# Patient Record
Sex: Male | Born: 1950
Health system: Southern US, Community
[De-identification: ages and names within clinical notes are randomized; demographics above are authoritative.]

## PROBLEM LIST (undated history)

## (undated) DIAGNOSIS — R7303 Prediabetes: Secondary | ICD-10-CM

## (undated) DIAGNOSIS — E785 Hyperlipidemia, unspecified: Secondary | ICD-10-CM

## (undated) DIAGNOSIS — G20C Parkinsonism, unspecified: Secondary | ICD-10-CM

## (undated) DIAGNOSIS — I10 Essential (primary) hypertension: Secondary | ICD-10-CM

## (undated) HISTORY — PX: OTHER SURGICAL HISTORY: SHX169

---

## 2001-04-13 HISTORY — PX: CORONARY ANGIOPLASTY WITH STENT PLACEMENT: SHX49

## 2006-10-22 ENCOUNTER — Ambulatory Visit: Payer: Self-pay | Admitting: Unknown Physician Specialty

## 2007-03-03 ENCOUNTER — Ambulatory Visit: Payer: Self-pay | Admitting: Cardiology

## 2008-08-16 ENCOUNTER — Ambulatory Visit: Payer: Self-pay | Admitting: Internal Medicine

## 2009-11-09 ENCOUNTER — Emergency Department: Payer: Self-pay | Admitting: Emergency Medicine

## 2012-05-02 ENCOUNTER — Ambulatory Visit: Payer: Self-pay | Admitting: Unknown Physician Specialty

## 2012-05-03 LAB — PATHOLOGY REPORT

## 2014-09-18 ENCOUNTER — Ambulatory Visit: Payer: BLUE CROSS/BLUE SHIELD | Attending: Internal Medicine

## 2014-09-18 DIAGNOSIS — G4733 Obstructive sleep apnea (adult) (pediatric): Secondary | ICD-10-CM | POA: Insufficient documentation

## 2014-11-14 ENCOUNTER — Ambulatory Visit: Payer: BLUE CROSS/BLUE SHIELD | Attending: Specialist

## 2014-11-14 DIAGNOSIS — Z9989 Dependence on other enabling machines and devices: Secondary | ICD-10-CM | POA: Diagnosis not present

## 2014-11-14 DIAGNOSIS — G4761 Periodic limb movement disorder: Secondary | ICD-10-CM | POA: Diagnosis not present

## 2014-11-14 DIAGNOSIS — G4733 Obstructive sleep apnea (adult) (pediatric): Secondary | ICD-10-CM | POA: Diagnosis present

## 2017-08-05 DIAGNOSIS — Z86010 Personal history of colonic polyps: Secondary | ICD-10-CM | POA: Diagnosis not present

## 2017-08-05 DIAGNOSIS — K625 Hemorrhage of anus and rectum: Secondary | ICD-10-CM | POA: Diagnosis not present

## 2017-09-01 DIAGNOSIS — I10 Essential (primary) hypertension: Secondary | ICD-10-CM | POA: Diagnosis not present

## 2017-09-01 DIAGNOSIS — I2510 Atherosclerotic heart disease of native coronary artery without angina pectoris: Secondary | ICD-10-CM | POA: Diagnosis not present

## 2017-09-01 DIAGNOSIS — G4733 Obstructive sleep apnea (adult) (pediatric): Secondary | ICD-10-CM | POA: Diagnosis not present

## 2017-09-16 DIAGNOSIS — K648 Other hemorrhoids: Secondary | ICD-10-CM | POA: Diagnosis not present

## 2017-09-16 DIAGNOSIS — Z86010 Personal history of colonic polyps: Secondary | ICD-10-CM | POA: Diagnosis not present

## 2017-09-16 DIAGNOSIS — K640 First degree hemorrhoids: Secondary | ICD-10-CM | POA: Diagnosis not present

## 2017-09-16 DIAGNOSIS — K5730 Diverticulosis of large intestine without perforation or abscess without bleeding: Secondary | ICD-10-CM | POA: Diagnosis not present

## 2017-12-24 DIAGNOSIS — E785 Hyperlipidemia, unspecified: Secondary | ICD-10-CM | POA: Diagnosis not present

## 2017-12-24 DIAGNOSIS — Z125 Encounter for screening for malignant neoplasm of prostate: Secondary | ICD-10-CM | POA: Diagnosis not present

## 2017-12-24 DIAGNOSIS — Z79899 Other long term (current) drug therapy: Secondary | ICD-10-CM | POA: Diagnosis not present

## 2017-12-24 DIAGNOSIS — I10 Essential (primary) hypertension: Secondary | ICD-10-CM | POA: Diagnosis not present

## 2017-12-27 DIAGNOSIS — I10 Essential (primary) hypertension: Secondary | ICD-10-CM | POA: Diagnosis not present

## 2017-12-27 DIAGNOSIS — G4733 Obstructive sleep apnea (adult) (pediatric): Secondary | ICD-10-CM | POA: Diagnosis not present

## 2017-12-27 DIAGNOSIS — Z0000 Encounter for general adult medical examination without abnormal findings: Secondary | ICD-10-CM | POA: Diagnosis not present

## 2018-03-14 DIAGNOSIS — I2510 Atherosclerotic heart disease of native coronary artery without angina pectoris: Secondary | ICD-10-CM | POA: Diagnosis not present

## 2018-03-14 DIAGNOSIS — I10 Essential (primary) hypertension: Secondary | ICD-10-CM | POA: Diagnosis not present

## 2018-03-14 DIAGNOSIS — G4733 Obstructive sleep apnea (adult) (pediatric): Secondary | ICD-10-CM | POA: Diagnosis not present

## 2018-06-27 DIAGNOSIS — R0689 Other abnormalities of breathing: Secondary | ICD-10-CM | POA: Diagnosis not present

## 2018-06-27 DIAGNOSIS — I2510 Atherosclerotic heart disease of native coronary artery without angina pectoris: Secondary | ICD-10-CM | POA: Diagnosis not present

## 2018-06-27 DIAGNOSIS — I10 Essential (primary) hypertension: Secondary | ICD-10-CM | POA: Diagnosis not present

## 2018-06-27 DIAGNOSIS — E785 Hyperlipidemia, unspecified: Secondary | ICD-10-CM | POA: Diagnosis not present

## 2018-06-27 DIAGNOSIS — Z79899 Other long term (current) drug therapy: Secondary | ICD-10-CM | POA: Diagnosis not present

## 2018-06-27 DIAGNOSIS — G4733 Obstructive sleep apnea (adult) (pediatric): Secondary | ICD-10-CM | POA: Diagnosis not present

## 2018-07-28 DIAGNOSIS — I10 Essential (primary) hypertension: Secondary | ICD-10-CM | POA: Diagnosis not present

## 2019-02-27 ENCOUNTER — Other Ambulatory Visit: Payer: Self-pay

## 2019-02-27 DIAGNOSIS — Z20822 Contact with and (suspected) exposure to covid-19: Secondary | ICD-10-CM

## 2019-03-01 LAB — NOVEL CORONAVIRUS, NAA: SARS-CoV-2, NAA: DETECTED — AB

## 2019-06-12 ENCOUNTER — Other Ambulatory Visit: Payer: Self-pay | Admitting: Internal Medicine

## 2019-06-12 DIAGNOSIS — R945 Abnormal results of liver function studies: Secondary | ICD-10-CM

## 2019-06-12 DIAGNOSIS — N289 Disorder of kidney and ureter, unspecified: Secondary | ICD-10-CM

## 2019-06-12 DIAGNOSIS — R7989 Other specified abnormal findings of blood chemistry: Secondary | ICD-10-CM

## 2019-06-16 ENCOUNTER — Other Ambulatory Visit: Payer: Self-pay

## 2019-06-16 ENCOUNTER — Ambulatory Visit
Admission: RE | Admit: 2019-06-16 | Discharge: 2019-06-16 | Disposition: A | Discharge status: Home / Self Care | Payer: 59 | Source: Ambulatory Visit | Attending: Internal Medicine | Admitting: Internal Medicine

## 2019-06-16 DIAGNOSIS — R945 Abnormal results of liver function studies: Secondary | ICD-10-CM | POA: Diagnosis present

## 2019-06-16 DIAGNOSIS — R7989 Other specified abnormal findings of blood chemistry: Secondary | ICD-10-CM

## 2019-06-16 DIAGNOSIS — N289 Disorder of kidney and ureter, unspecified: Secondary | ICD-10-CM | POA: Insufficient documentation

## 2020-08-23 ENCOUNTER — Other Ambulatory Visit: Payer: Self-pay | Admitting: Internal Medicine

## 2020-08-23 DIAGNOSIS — R270 Ataxia, unspecified: Secondary | ICD-10-CM

## 2020-09-05 ENCOUNTER — Other Ambulatory Visit: Payer: Self-pay

## 2020-09-05 ENCOUNTER — Ambulatory Visit
Admission: RE | Admit: 2020-09-05 | Discharge: 2020-09-05 | Disposition: A | Discharge status: Home / Self Care | Payer: Medicare Other | Source: Ambulatory Visit | Attending: Internal Medicine | Admitting: Internal Medicine

## 2020-09-05 DIAGNOSIS — R270 Ataxia, unspecified: Secondary | ICD-10-CM | POA: Insufficient documentation

## 2020-12-12 ENCOUNTER — Ambulatory Visit: Payer: Medicare Other | Attending: Neurology

## 2020-12-12 DIAGNOSIS — R278 Other lack of coordination: Secondary | ICD-10-CM | POA: Diagnosis present

## 2020-12-12 DIAGNOSIS — M6281 Muscle weakness (generalized): Secondary | ICD-10-CM | POA: Insufficient documentation

## 2020-12-12 DIAGNOSIS — G20 Parkinson's disease: Secondary | ICD-10-CM | POA: Insufficient documentation

## 2020-12-12 DIAGNOSIS — R2689 Other abnormalities of gait and mobility: Secondary | ICD-10-CM | POA: Diagnosis present

## 2020-12-12 DIAGNOSIS — G20A1 Parkinson's disease without dyskinesia, without mention of fluctuations: Secondary | ICD-10-CM

## 2020-12-12 DIAGNOSIS — R2681 Unsteadiness on feet: Secondary | ICD-10-CM

## 2020-12-13 NOTE — Therapy (Signed)
[facility] MAIN [facility] SERVICES [address] [place], [place], [address] Phone: [phone]   Fax:  [phone]  Physical Therapy Evaluation  Patient Details  Name: Kyle Conner MRN: [id number] Date of Birth: [birth year] Referring Provider (PT): Lonell Face, MD   Encounter Date: 12/12/2020   PT End of Session - 12/13/20 1151     Visit Number 1    Number of Visits 17    Date for PT Re-Evaluation 01/16/21    Authorization Type LSVT BIG, eval performed 12/12/2020    Authorization Time Period 12/12/2020-01/16/2021    Authorization - Visit Number 1    Authorization - Number of Visits 17    Equipment Utilized During Treatment Gait belt    Activity Tolerance Patient tolerated treatment well    Behavior During Therapy [facility] for tasks assessed/performed             History reviewed. No pertinent past medical history.  History reviewed. No pertinent surgical history.  There were no vitals filed for this visit.    Subjective Assessment - 12/12/20 0956     Subjective Pt is a pleasant 70 y/o male presenting to PT for LSVT BIG evaluation. Pt reports onset of Parkinson's sx occured over 3 years ago with noticing initial changes in gait and tremor of L hand. Pt reports gradual worsening of gait with shuffling, festination. Denies freezing episodes. Reports one fall over six months ago d/t gait festination. Pt also reports feelings of "stiffness" impacting his ability to perform transfers. He reports FOF with ambulating and ascending/descending steps. Pt does not use an AD, but does express that gait is his greatest concern, where he is unable to ambulate for long periods of time and has difficulty with ambulating over uneven, compliant surfaces and up/down hills. He has recently retired and feels this has negatively impacted his mobility, but did see some improvement after joining a gym to strengthen LEs. Pt reports worsening handwriting (but not  decreased in size), does have LUE Dupuytren contracture of 5th digit, is ambidextrous but writes with L. Pt lives with his wife and son. Reports wife must help him clip toenails as he has trouble bending to reach his feet. He would like to be able to safely go on long walks with his wife and attend his son's soccer games without fear of falls.    Pertinent History Pt is a pleasant 70 y/o male presenting to PT for LSVT BIG evaluation. Pt reports onset of Parkinson's sx occured over 3 years ago with noticing initial changes in gait and tremor of L hand. Pt reports gradual worsening of gait with shuffling, festination. Denies freezing episodes. Reports one fall over six months ago d/t gait festination. Pt also reports feelings of "stiffness" impacting his ability to perform transfers. He reports FOF with ambulating and ascending/descending steps. Pt does not use an AD, but does express that gait is his greatest concern, where he is unable to ambulate for long periods of time and has difficulty with ambulating over uneven, compliant surfaces and up/down hills. Per chart other PMH is significant for: chronic lacunar infarct within L lentiform nucleus, sleep apnea (uses CPAP), Dupuytren Contracture LUE 5th digit, mild visual hallucinations, COVID (2020), uses hearing aids, blockage of coronary artery of heart, h/o cardiac cath (2008), HLD, HTN.    Limitations Walking;Writing;Standing;Sitting;House hold activities    How long can you sit comfortably? Pt reports >1 hr if chair is padded; pt reports if hard chair  he is limited to 30 min d/t discomfort from numbness in LEs    How long can you stand comfortably? Pt reports if he is fatigued he is only able to stand 30-45 min.    How long can you walk comfortably? 50 yards    Diagnostic tests imging 09/05/2020 MR brain WO contrast: "IMPRESSION:  No evidence of acute intracranial abnormality.  Moderate generalized cerebral atrophy and cerebral white matter  chronic small  vessel ischemic disease, progressed from the head CT  of 11/09/2009.  Small chronic lacunar infarct within the left lentiform nucleus.  Comparatively mild cerebellar atrophy.  Minimal right maxillary sinus mucosal thickening."    Patient Stated Goals Would like to walk without shuffling    Currently in Pain? No/denies                [facility] PT Assessment - 12/12/20 1018       Assessment   Medical Diagnosis Parkinson's    Referring Provider (PT) Lonell Face, MD    Onset Date/Surgical Date --   over 3 years ago   Hand Dominance Right;Left    Next MD Visit in september    Prior Therapy no      Precautions   Precautions Fall      Restrictions   Weight Bearing Restrictions No      Balance Screen   Has the patient fallen in the past 6 months No      Home Environment   Living Environment Private residence    Living Arrangements Spouse/significant other;Children    Available Help at Discharge Family    Type of Home House    Home Access Stairs to enter    Entrance Stairs-Number of Steps 4    Entrance Stairs-Rails Left    Home Layout Two level    Alternate Level Stairs-Number of Steps 12    Alternate Level Stairs-Rails Right    Home Equipment Rote - single point;Shower seat - built in;Other (comment)      Prior Function   Level of Independence Independent    Vocation Retired      IT consultant   Overall Cognitive Status Within Functional Limits for tasks assessed      Standardized Balance Assessment   Standardized Balance Assessment Dynamic Gait Index      Dynamic Gait Index   Level Surface Normal    Change in Gait Speed Mild Impairment    Gait with Horizontal Head Turns Normal    Gait with Vertical Head Turns Normal    Gait and Pivot Turn Normal    Step Over Obstacle Mild Impairment    Step Around Obstacles Severe Impairment    Steps Mild Impairment    Total Score 18             LSVT BIG EVALUATION  Neurological and Other Medical Information:  What were  your initial symptoms of Parkinson disease? Pt reports symptoms likely started >3 years ago. He reports first noticing tremor with L hand when reaching for a coffee mug. Pt is now retired, but reports his job at the time required ascending/descending stairs and climbing ladders. He started having difficulty with these tasks. He reports changes in his gait, such as shuffling and leaning forward until almost falling and needing to "catch" himself. Pt did report some improvements in gait after joining a gym and focusing on LE strengthening.   Do you have tremors?  Yes__x_ No____   Do you have any pain? Yes ____ No __x___  If yes, please describe: Pain rating (on scale of 1-10 with 10 being most severe):   Medical Information:   Medication for Parkinson disease: Pt reports he is taking medication specific to treating Parkinson's. He is unsure of the name of the medication. He reports he takes 1 pill 3x/day. Pt reports he and his doctor have discussed possibly increasing dose as pt does not feel the medication is currently helping.    In what ways are your medication(s) for Parkinson's helpful? Pt is unsure if medication or exercise is affecting his sx more.   Do you experience on/off symptoms? Yes _x___ No_____ If yes, please describe: Pt reports sx are worst when he is tired and best in the morning.   Do you experience any dyskinesias? Yes _x____ No _____ If yes, please describe: Reports most noticeable when he is ambulating over uneven surfaces, such as terrain at his son's soccer games and with hills or longer distances.   Motor Symptoms:   When did you first start to notice changes in your movement you associate with Parkinson disease? 3+ years ago  What are your current symptoms? Pt reports tremor (reports not constant), gait difficulty  What is your most significant problem related to movement today? Gait difficulty  What do you do when you want to move the best you possibly can? Pt  reports he uses the elliptical at his gym and machines for LE strengthening at fitness center. His friend who also has Parkinson's works out with him.  Has Parkinson disease caused you to move less or be less active? Yes __x__ No____ If yes, how much less? Reports also d/t retiring and becoming more sedentary.   Why has Parkinson disease caused you to move less? Pt reports he doesn't want to fall. Reports only 1 fall (greater than 6 months ago). Reports he was fatigued with walking outside and fell forward on the grass, no injuries.   Have you noticed if your movement is slower than it used to be? For example, walking, getting dressed, doing household chores, bathing, etc.  Yes _x__ No____ If yes please describe: Reports decreased gait speed  Have you or others noticed any changes in your posture? Yes _x___ No____ If yes, please describe: Reports pt feels he leans forward  How many (if any) falls have you had in the last six months? 0  What factors contributed to those falls? No recent falls, but does report history of 1 fall over 6 months ago. Pt reports it occurred when he was trying to increase is walking speed.  Have you noticed changes in your stamina? Yes _x__ No_____  If yes, please describe:  Have you noticed any freezing with your movement? Yes x____ No____ If yes, when do you notice freezing? Pt reports feeling a "stiff" with transfers.   Are there some activities you now need help with because of your Parkinson disease? For example, getting socks or shoes on, buttoning, getting up from low chairs, walking on uneven ground, etc.  Pt states "I can't reach my toes." Pt reports difficulty with bending, his wife helps with clipping his toenails.   Have you noticed any changes in the functioning of your hands? Yes ____ No____x_ ; If yes please describe: Does report occasional tremor. Otherwise, reports no changes d/t Parkinson's, but has trigger finger on R hand and reports Dupuytren's  contracture on L hand. Pt says he has had these issues "for years."   Has Parkinson disease caused you to use your  more affected hand less? Yes ____ No_x___Pt is ambidextrous and primarily writes using L hand.  If you have problems with your hand function today, what is/are the most significant problem (s)? Have you noticed any changes in your ability to: -Button: -Dial the phone: -Open containers: -Manipulate money: -Tie shoes: -Write:  -Type or use a computer: -Other: Pt reports he now has "terrible handwriting," that it is less legible, however it is not smaller.    Have you noticed if your hands feel any weaker than they used to? Yes _____ No___x__ If yes, please describe: Does report decreased R arm strength, however, says this is d/t tick bite related injury and past operation on R bicep.   Movement Situations:   If you had one situation in which you wanted to move well, what would it be? Walking  Describe your day in terms of mobility or movement activity/situations (who is present, how do you move, when does it occur, with what devices). When do you find it most difficult to move? Pt reports most difficulty with gait  Why is it difficult to move in these situations/times that you mentioned? Shuffling, anterior lean that makes him feel he is about to fall  What would you like to improve about your ability to move? Gait ability   Assessment:    MMT: gross B LE and UE strength is 5/5 Grip strength is specifically 5/5 R, 4+/5 L  Coordination Some psat-pointing,  with finger to nose (PT finger) Other WFL hands onlap, heel to shin   Coordination/Cerebellar Finger to Nose: Slightly bradykinetic, initial reps slightly dysmetric, improves with reps Heel to Shin: WNL Rapid alternating movements: WNL  Sensation  WNL    Sit to stand assessment:  5x STS: 13.48 sec hands free  Gait assessments:  10 MWT:  0.89 m/s, no AD Comments: Pt exhibits decreased B  step-length, decreased trunk rotation and B arm-swing (R<L), decreased hip extension, shuffling.    Balance Assessments: DGI: 18/24 (indicates fall risk) Comments: Pt exhibited greatest difficulty with change in gait speed, stepping over obstacles and navigating around cones, ascending/descending stairs. Pt observed to be unable to increase gait speed beyond baseline speed. Pt had to decrease speed/almost stop when approaching obstacle for safe foot clearance, slightly bradykinetic. When navigating around cones, pt walks into two cones with heel. Pt requires use of 2 handrails for ascending/descending steps reporting he is worried he'll fall if not using B handrails.  SLB:  2-3 sec BLEs Comments: close CGA, must use UE support to prevent fall.   FOTO: 52 (goal 61)  Functional Component movements:   Sit to stand (required) Floor to stand Stepping over object Walking and turning (around obstacle) Walking on uneven ground Hierarchies:  Going for a long walk with his wife in areas with hills, grass (rates very difficult) Bending over to reach feet to clip toenails (rates very difficult)   Education: PT provides education on length, intensity, frequency of LSVT BIG program, frequency of HEP with program. Pt verbalizes understanding.    Objective measurements completed on examination: See above findings.       PT Education - 12/12/20 1031     Education Details Examination findings, POC, purpose of LSVT BIG, frequency, intensity of program, what to expect    Person(s) Educated Patient    Methods Explanation    Comprehension Verbalized understanding              PT Short Term Goals - 12/13/20 1158  PT SHORT TERM GOAL #1   Title Pt will be independent with LSVT BIG HEP in order to improve functional mobility and balance to decrease fall risk and improve function at home.    Time 3    Period Weeks    Status New    Target Date 01/02/21               PT Long Term  Goals - 12/13/20 1202       PT LONG TERM GOAL #1   Title Patient will increase FOTO score to equal to or greater than 61 to demonstrate statistically significant improvement in mobility and quality of life.    Baseline 9/1: 52    Time 5    Period Weeks    Status New    Target Date 01/16/21      PT LONG TERM GOAL #2   Title Patient will tolerate 5 seconds of single leg stance without loss of balance to improve ability to step over obstacles/up and down curbs safely.    Baseline 9/1: 2-3 sec BLEs    Time 5    Period Weeks    Status New    Target Date 01/16/21      PT LONG TERM GOAL #3   Title Pt will improve DGI by at least 3 points in order to demonstrate clinically significant improvement in balance and decreased risk for falls so pt can safely go on walks outdoors with his wife.    Baseline 9/1: 18/24    Time 5    Period Weeks    Status New    Target Date 01/16/21      PT LONG TERM GOAL #4   Title Patient will increase 10 meter walk test to >1.2m/s as to improve gait speed for better community ambulation.    Baseline 9/1: 0.89 m/s    Time 5    Period Weeks    Status New    Target Date 01/16/21                    Plan - 12/13/20 1214     Clinical Impression Statement Pt is a pleasant 70 y/o male with Parkinson's disease with c/o FOF due to impairments of gait, balance and decreased functional mobility. Examination reveals pt movement is slightly bradykinetic and dysmetric. The pt exhibits impaired gait speed and mechanics as evidenced by score, shuffling gait, decreased B step-length, trunk rotation, arm swing and hip extension. Pt with balance impairment AEB SLB and DGI scores, which also indicate pt is at an increased risk for falls. He was most challenged with turning, navigating obstacles, stepping over objects, and ascending/descnending steps showing mild hypokinetic movement. Pt also reported stiffness during examination with STS. Overall, the  aforementioned impairments are affecting pt's ability to carry out ADLs and transfers safely and with ease. The pt is appropriate for treatment with LSVT BIG Protocol, which is an evidenced-based treatment shown to improve gait speed, step-length, balance and function with ADLs in people with PD. LSVT BIG is delivered 4 times per week for 4 weeks by a certified LSVT BIG therapist. LSVT BIG focuses on increasing amplitude of movements so that the patient can effectively override bradykinesia and hypokinesia symptoms of PD. The patient is in agreement with goals and plan of care and will benefit from further skilled PT to improve the aforementioned deficits.    Personal Factors and Comorbidities Age;Comorbidity 1;Comorbidity 2;Comorbidity 3+;Fitness;Time since onset of injury/illness/exacerbation  Comorbidities chronic lacunar infarct within L lentiform nucleus, hx of COVID19 (2020), HTN, duputyren contracture LUE 5th digit,    Examination-Activity Limitations Hygiene/Grooming;Bend;Stand;Sit;Locomotion Level;Transfers;Stairs;Dressing;Squat    Examination-Participation Restrictions Community Activity;Shop;Yard Work;Cleaning;Laundry    Stability/Clinical Decision Making Evolving/Moderate complexity    Clinical Decision Making Moderate    Rehab Potential Good    PT Frequency 4x / week    PT Duration Other (comment)   coverage for 5 weeks to cover the full 4 week program from evaluation onward   PT Treatment/Interventions ADLs/Self Care Home Management;DME Instruction;Gait training;Stair training;Functional mobility training;Therapeutic activities;Balance training;Therapeutic exercise;Neuromuscular re-education;Patient/family education;Orthotic Fit/Training;Manual techniques;Passive range of motion;Energy conservation;Taping;Splinting    PT Next Visit Plan Initiate maximal daily exercises, functional component task training, hirarchy task trianing, BIG walking, HEP/daiy carryover assignments    PT Home  Exercise Plan to be initiated next session    Consulted and Agree with Plan of Care Patient             Patient will benefit from skilled therapeutic intervention in order to improve the following deficits and impairments:  Abnormal gait, Decreased activity tolerance, Decreased endurance, Decreased range of motion, Decreased knowledge of use of DME, Hypomobility, Improper body mechanics, Impaired UE functional use, Decreased balance, Decreased coordination, Decreased mobility, Difficulty walking, Impaired flexibility, Postural dysfunction  Visit Diagnosis: Parkinson disease (HCC) - Plan: PT plan of care cert/re-cert  Other abnormalities of gait and mobility - Plan: PT plan of care cert/re-cert  Unsteadiness on feet - Plan: PT plan of care cert/re-cert     Problem List There are no problems to display for this patient.  Temple Pacini PT, DPT 12/13/2020, 12:44 PM  [place] [facility] MAIN [facility] SERVICES [address] [place], [place], [address] Phone: [phone]   Fax:  [phone]  Name: Kyle Conner MRN: [id number] Date of Birth: [birth year]

## 2020-12-17 ENCOUNTER — Ambulatory Visit: Payer: Medicare Other

## 2020-12-17 ENCOUNTER — Other Ambulatory Visit: Payer: Self-pay

## 2020-12-17 DIAGNOSIS — R278 Other lack of coordination: Secondary | ICD-10-CM

## 2020-12-17 DIAGNOSIS — M6281 Muscle weakness (generalized): Secondary | ICD-10-CM

## 2020-12-17 DIAGNOSIS — R2681 Unsteadiness on feet: Secondary | ICD-10-CM

## 2020-12-17 DIAGNOSIS — R2689 Other abnormalities of gait and mobility: Secondary | ICD-10-CM

## 2020-12-17 DIAGNOSIS — G20 Parkinson's disease: Secondary | ICD-10-CM | POA: Diagnosis not present

## 2020-12-17 DIAGNOSIS — G20A1 Parkinson's disease without dyskinesia, without mention of fluctuations: Secondary | ICD-10-CM

## 2020-12-17 NOTE — Therapy (Signed)
[facility] MAIN [facility] SERVICES [address] [place], [place], [address] Phone: [phone]   Fax:  [phone]  Physical Therapy Treatment  Patient Details  Name: Kyle Conner MRN: [id number] Date of Birth: [birth year] Referring Provider (PT): Lonell Face, MD   Encounter Date: 12/17/2020   PT End of Session - 12/17/20 1127     Visit Number 2    Number of Visits 17    Date for PT Re-Evaluation 01/16/21    Authorization Type LSVT BIG, eval performed 12/12/2020    Authorization Time Period 12/12/2020-01/16/2021    Authorization - Visit Number 1    Authorization - Number of Visits 17    PT Start Time 1002    PT Stop Time 1100    PT Time Calculation (min) 58 min    Equipment Utilized During Treatment Gait belt    Activity Tolerance Patient tolerated treatment well    Behavior During Therapy [facility] for tasks assessed/performed             History reviewed. No pertinent past medical history.  History reviewed. No pertinent surgical history.  There were no vitals filed for this visit.   Subjective Assessment - 12/17/20 1004     Subjective Pt reports no pain currently and feeling OK. Pt denies falls or near-falls.    Pertinent History Pt is a pleasant 71 y/o male presenting to PT for LSVT BIG evaluation. Pt reports onset of Parkinson's sx occured over 3 years ago with noticing initial changes in gait and tremor of L hand. Pt reports gradual worsening of gait with shuffling, festination. Denies freezing episodes. Reports one fall over six months ago d/t gait festination. Pt also reports feelings of "stiffness" impacting his ability to perform transfers. He reports FOF with ambulating and ascending/descending steps. Pt does not use an AD, but does express that gait is his greatest concern, where he is unable to ambulate for long periods of time and has difficulty with ambulating over uneven, compliant surfaces and up/down hills. Per chart  other PMH is significant for: chronic lacunar infarct within L lentiform nucleus, sleep apnea (uses CPAP), Dupuytren Contracture LUE 5th digit, mild visual hallucinations, COVID (2020), uses hearing aids, blockage of coronary artery of heart, h/o cardiac cath (2008), HLD, HTN.    Limitations Walking;Writing;Standing;Sitting;House hold activities    How long can you sit comfortably? Pt reports >1 hr if chair is padded; pt reports if hard chair he is limited to 30 min d/t discomfort from numbness in LEs    How long can you stand comfortably? Pt reports if he is fatigued he is only able to stand 30-45 min.    How long can you walk comfortably? 50 yards    Diagnostic tests imging 09/05/2020 MR brain WO contrast: "IMPRESSION:  No evidence of acute intracranial abnormality.  Moderate generalized cerebral atrophy and cerebral white matter  chronic small vessel ischemic disease, progressed from the head CT  of 11/09/2009.  Small chronic lacunar infarct within the left lentiform nucleus.  Comparatively mild cerebellar atrophy.  Minimal right maxillary sinus mucosal thickening."    Patient Stated Goals Would like to walk without shuffling    Currently in Pain? No/denies              LSVT BIG Treatment:  Patient seen for LSVT Daily Session Maximal Daily Exercises for facilitation/coordination of movement Sustained movements are designed to rescale the amplitude of movement output for generalization to daily functional activities.  Maximal Daily Exercises: Floor to Ceiling 8 with 10 second hold Side to Side 8 Bilateral with 10 second hold  Forward Step and Reach 10 Bilateral Sideways Step and Reach 10 Bilateral Backwards Step and Reach 10 Bilateral Forwards Rock and Reach 10 Bilateral Sideways Rock and Reach 10 Bilateral  Maximal Daily Exercise Comments: Pt currently requires modeling along with VC/TC for all Maximal Daily Exercises. Pt with greatest difficult with trunk rotation, UE abduction,  flexion and supination. PT provides frequent hands-on assist for UE supination along with VC. Cuing for increased effort and amplitude throughout as well was for upright posture to override hypokinesia and bradykinesia. Pt rates exercises as medium.  Functional Component Tasks - performance and progress: 5 reps for each, and 5 reps for each LE with tasks 2-5. Sit to Stand x 5 from standard chair with cuing for big reach/big effort with UE reach and abduction upon standing, cuing for finger abduction/big hands.  2. Lunge to stand with UE support (modified from original task of floor to stand to assess if pt has balance necessary for task, may be able to return to floor to stand next session depending on pt report with homework).  3. Big step over object; pt fatigued, required rest break 4. Walk and Turn (around object); 5. Walk on compliant surface Comments: PT instructs pt to use UE support on support surface, while PT also provides SBA-CGA throughout. Frequent use of modeling, cuing for large amplitude/big effort, to increase step-length, hip flexion for stepping over object.   Hierarchy Tasks - performance and progress: Seated big reach 10x with each UE to each ipsilateral LE.  2. Walking on mat also performed as functional component task to challenge pt with gait mechanics on compliant surface to simulate grassy surface, with CGA.   Gait - SBA-CGA provided due to festination.  PT provides cuing for big steps, big posture, big arm swing 90% of time to override hypokinesia/bradykinesia. Pt requires frequent cuing to sustain increased amplitude. Pt ambulated 462 feet on firm surfaces with some distractions d/t business of clinic, kicked over a cone that was on floor, difficulty scanning with distractions.   Carryover Assignment: Pt instructed to use big effort with stepping out of car with instructions for safety (scan to clear car door as not to hit head/self on car when performing).  Pt  verbalized understanding.  Homework: Pt to perform all 7 LSVT BIG exercises, 5 Functional Component Tasks and big walking. Instructed to use support surface for functional component tasks (except for STS) on this date for safety.    Pt educated throughout session about proper posture and technique with exercises. Improved exercise technique, movement at target joints, use of target muscles after min to mod verbal, visual, tactile cues.    PT Short Term Goals - 12/17/20 1131       PT SHORT TERM GOAL #1   Title Pt will be independent with LSVT BIG HEP in order to improve functional mobility and balance to decrease fall risk and improve function at home.    Baseline 9/6: initiated    Time 3    Period Weeks    Status New    Target Date 01/02/21               PT Long Term Goals - 12/13/20 1202       PT LONG TERM GOAL #1   Title Patient will increase FOTO score to equal to or greater than 61 to demonstrate statistically significant improvement  in mobility and quality of life.    Baseline 9/1: 52    Time 5    Period Weeks    Status New    Target Date 01/16/21      PT LONG TERM GOAL #2   Title Patient will tolerate 5 seconds of single leg stance without loss of balance to improve ability to step over obstacles/up and down curbs safely.    Baseline 9/1: 2-3 sec BLEs    Time 5    Period Weeks    Status New    Target Date 01/16/21      PT LONG TERM GOAL #3   Title Pt will improve DGI by at least 3 points in order to demonstrate clinically significant improvement in balance and decreased risk for falls so pt can safely go on walks outdoors with his wife.    Baseline 9/1: 18/24    Time 5    Period Weeks    Status New    Target Date 01/16/21      PT LONG TERM GOAL #4   Title Patient will increase 10 meter walk test to >1.35m/s as to improve gait speed for better community ambulation.    Baseline 9/1: 0.89 m/s    Time 5    Period Weeks    Status New    Target Date 01/16/21                    Plan - 12/17/20 1118     Clinical Impression Statement Initiated maximal daily exercises, functional component tasks, hierarchy tasks, gait training and carryover assignment with HEP this session. Pt verbalized understanding of HEP and safety instructions with HEP. Pt currently requires frequent use of modeling/demo, VC/TC with all interventions to override bradykinesia and hypokinesia, particularly with use of UEs for arm swing and with stepping. Pt exhibited festinating gait with intermittent ability to improve step-length and arm-swing this session. He required cuing for correct gait mechanics and sustained large effort 90% of the time. The pt will benefit from further skilled LSVT BIG treatment to continue to improve bradykinesia, hypokinesia, impaired gat mechanics, and decreased ROM to improve ease and safety with ADLs.    Personal Factors and Comorbidities Age;Comorbidity 1;Comorbidity 2;Comorbidity 3+;Fitness;Time since onset of injury/illness/exacerbation    Comorbidities chronic lacunar infarct within L lentiform nucleus, hx of COVID19 (2020), HTN, duputyren contracture LUE 5th digit,    Examination-Activity Limitations Hygiene/Grooming;Bend;Stand;Sit;Locomotion Level;Transfers;Stairs;Dressing;Squat    Examination-Participation Restrictions Community Activity;Shop;Yard Work;Cleaning;Laundry    Stability/Clinical Decision Making Evolving/Moderate complexity    Rehab Potential Good    PT Frequency 4x / week    PT Duration Other (comment)   coverage for 5 weeks to cover the full 4 week program from evaluation onward   PT Treatment/Interventions ADLs/Self Care Home Management;DME Instruction;Gait training;Stair training;Functional mobility training;Therapeutic activities;Balance training;Therapeutic exercise;Neuromuscular re-education;Patient/family education;Orthotic Fit/Training;Manual techniques;Passive range of motion;Energy conservation;Taping;Splinting    PT  Next Visit Plan Initiate maximal daily exercises, functional component task training, hirarchy task trianing, BIG walking, HEP/daiy carryover assignments    PT Home Exercise Plan 7 LSVT BIG exercises, 5 functional component tasks, big walking and carryover assignment (see note 9/6 for carryover). Handout/packet provided with additional written instructions.    Consulted and Agree with Plan of Care Patient             Patient will benefit from skilled therapeutic intervention in order to improve the following deficits and impairments:  Abnormal gait, Decreased activity tolerance, Decreased endurance, Decreased range of  motion, Decreased knowledge of use of DME, Hypomobility, Improper body mechanics, Impaired UE functional use, Decreased balance, Decreased coordination, Decreased mobility, Difficulty walking, Impaired flexibility, Postural dysfunction  Visit Diagnosis: Other abnormalities of gait and mobility  Other lack of coordination  Muscle weakness (generalized)  Unsteadiness on feet  Parkinson disease (HCC)     Problem List There are no problems to display for this patient.  Temple PaciniHaley Nitesh Pitstick PT, DPT 12/17/2020, 11:32 AM  [facility] [address] [place], [place], [address] Phone: [phone]   Fax:  [phone]  Name: Kyle Conner MRN: [id number] Date of Birth: 09/24/1950

## 2020-12-18 ENCOUNTER — Ambulatory Visit: Payer: Medicare Other

## 2020-12-18 DIAGNOSIS — R2689 Other abnormalities of gait and mobility: Secondary | ICD-10-CM

## 2020-12-18 DIAGNOSIS — G20 Parkinson's disease: Secondary | ICD-10-CM | POA: Diagnosis not present

## 2020-12-18 DIAGNOSIS — M6281 Muscle weakness (generalized): Secondary | ICD-10-CM

## 2020-12-18 DIAGNOSIS — R2681 Unsteadiness on feet: Secondary | ICD-10-CM

## 2020-12-18 DIAGNOSIS — R278 Other lack of coordination: Secondary | ICD-10-CM

## 2020-12-18 NOTE — Therapy (Signed)
[facility] MAIN [facility] SERVICES [address] [place], [place], [address] Phone: [phone]   Fax:  [phone]  Physical Therapy Treatment  Patient Details  Name: Kyle Conner MRN: [id number] Date of Birth: [birth year] Referring Provider (PT): Lonell Face, MD   Encounter Date: 12/18/2020   PT End of Session - 12/18/20 1129     Visit Number 3    Number of Visits 17    Date for PT Re-Evaluation 01/16/21    Authorization Type LSVT BIG, eval performed 12/12/2020    Authorization Time Period 12/12/2020-01/16/2021    Authorization - Visit Number 1    Authorization - Number of Visits 17    PT Start Time 1019    PT Stop Time 1115    PT Time Calculation (min) 56 min    Equipment Utilized During Treatment Gait belt    Activity Tolerance Patient tolerated treatment well;Patient limited by fatigue    Behavior During Therapy [facility] for tasks assessed/performed             History reviewed. No pertinent past medical history.  History reviewed. No pertinent surgical history.  There were no vitals filed for this visit.   Subjective Assessment - 12/18/20 1019     Subjective Pt reports no pain. Pt reports he did not do HEP and says he forgot about and is going to try to remember to perform it today.    Pertinent History Pt is a pleasant 70 y/o male presenting to PT for LSVT BIG evaluation. Pt reports onset of Parkinson's sx occured over 3 years ago with noticing initial changes in gait and tremor of L hand. Pt reports gradual worsening of gait with shuffling, festination. Denies freezing episodes. Reports one fall over six months ago d/t gait festination. Pt also reports feelings of "stiffness" impacting his ability to perform transfers. He reports FOF with ambulating and ascending/descending steps. Pt does not use an AD, but does express that gait is his greatest concern, where he is unable to ambulate for long periods of time and has difficulty  with ambulating over uneven, compliant surfaces and up/down hills. Per chart other PMH is significant for: chronic lacunar infarct within L lentiform nucleus, sleep apnea (uses CPAP), Dupuytren Contracture LUE 5th digit, mild visual hallucinations, COVID (2020), uses hearing aids, blockage of coronary artery of heart, h/o cardiac cath (2008), HLD, HTN.    Limitations Walking;Writing;Standing;Sitting;House hold activities    How long can you sit comfortably? Pt reports >1 hr if chair is padded; pt reports if Conner chair he is limited to 30 min d/t discomfort from numbness in LEs    How long can you stand comfortably? Pt reports if he is fatigued he is only able to stand 30-45 min.    How long can you walk comfortably? 50 yards    Diagnostic tests imging 09/05/2020 MR brain WO contrast: "IMPRESSION:  No evidence of acute intracranial abnormality.  Moderate generalized cerebral atrophy and cerebral white matter  chronic small vessel ischemic disease, progressed from the head CT  of 11/09/2009.  Small chronic lacunar infarct within the left lentiform nucleus.  Comparatively mild cerebellar atrophy.  Minimal right maxillary sinus mucosal thickening."    Patient Stated Goals Would like to walk without shuffling    Currently in Pain? No/denies                 [facility] MAIN [facility] SERVICES [address] [place],  [place], [address] Phone: [phone]   Fax:  [phone]  Physical Therapy Treatment  Patient Details  Name: Kyle Conner MRN: [id number] Date of Birth: 14-Jun-1950 Referring Provider (PT): Lonell Face, MD   Encounter Date: 12/18/2020   PT End of Session - 12/18/20 1129     Visit Number 3    Number of Visits 17    Date for PT Re-Evaluation 01/16/21    Authorization Type LSVT BIG, eval performed 12/12/2020    Authorization Time Period 12/12/2020-01/16/2021    Authorization - Visit Number 1    Authorization - Number of Visits 17    PT  Start Time 1019    PT Stop Time 1115    PT Time Calculation (min) 56 min    Equipment Utilized During Treatment Gait belt    Activity Tolerance Patient tolerated treatment well;Patient limited by fatigue    Behavior During Therapy [facility] for tasks assessed/performed              History reviewed. No pertinent past medical history.  History reviewed. No pertinent surgical history.  There were no vitals filed for this visit.   Subjective Assessment - 12/18/20 1019     Subjective Pt reports no pain. Pt reports he did not do HEP and says he forgot about and is going to try to remember to perform it today.    Pertinent History Pt is a pleasant 70 y/o male presenting to PT for LSVT BIG evaluation. Pt reports onset of Parkinson's sx occured over 3 years ago with noticing initial changes in gait and tremor of L hand. Pt reports gradual worsening of gait with shuffling, festination. Denies freezing episodes. Reports one fall over six months ago d/t gait festination. Pt also reports feelings of "stiffness" impacting his ability to perform transfers. He reports FOF with ambulating and ascending/descending steps. Pt does not use an AD, but does express that gait is his greatest concern, where he is unable to ambulate for long periods of time and has difficulty with ambulating over uneven, compliant surfaces and up/down hills. Per chart other PMH is significant for: chronic lacunar infarct within L lentiform nucleus, sleep apnea (uses CPAP), Dupuytren Contracture LUE 5th digit, mild visual hallucinations, COVID (2020), uses hearing aids, blockage of coronary artery of heart, h/o cardiac cath (2008), HLD, HTN.    Limitations Walking;Writing;Standing;Sitting;House hold activities    How long can you sit comfortably? Pt reports >1 hr if chair is padded; pt reports if Conner chair he is limited to 30 min d/t discomfort from numbness in LEs    How long can you stand comfortably? Pt reports if he is fatigued he is  only able to stand 30-45 min.    How long can you walk comfortably? 50 yards    Diagnostic tests imging 09/05/2020 MR brain WO contrast: "IMPRESSION:  No evidence of acute intracranial abnormality.  Moderate generalized cerebral atrophy and cerebral white matter  chronic small vessel ischemic disease, progressed from the head CT  of 11/09/2009.  Small chronic lacunar infarct within the left lentiform nucleus.  Comparatively mild cerebellar atrophy.  Minimal right maxillary sinus mucosal thickening."    Patient Stated Goals Would like to walk without shuffling    Currently in Pain? No/denies              LSVT BIG Treatment:  Patient seen for LSVT Daily Session Maximal Daily Exercises for facilitation/coordination of movement Sustained movements are designed to rescale the amplitude of movement output for  generalization to daily functional activities.  Maximal Daily Exercises: Floor to Ceiling 8 with 10 second hold Side to Side 8 Bilateral with 10 second hold  Forward Step and Reach 10 Bilateral Sideways Step and Reach 10 Bilateral Backwards Step and Reach 10 Bilateral Forwards Rock and Reach 10 Bilateral Sideways Rock and Reach 10 Bilateral  Maximal Daily Exercise Comments: PT continues to provide multimodal cueing including verbal cues for larger amplitude movement with all maximal daily exercises.  Patient continues to require hands-on assist for shaping with upright posture and trunk rotation as well as upper extremity abduction.  Patient has greatest difficulty with trunk rotation to the right.  Patient reports fatigue with exercise.  Functional Component Tasks - performance and progress: 5 reps for each, and 5 reps for each LE with tasks 2-5. Sit to Stand x 5 from standard chair with cuing for big reach/big effort with UE reach and abduction upon standing, continued cuing for finger abduction/big hands.  2. Kneeling lunge to stand with UE support - reports easier to stand up from RLE  kneeling compared to LLE. Improved technique with reps. 3. Big step over object; cueing for increased step height and length bilaterally 4. Walk and Turn (around object); 5. Walk on compliant surface; patient with decreased postural stability and uses step strategy 2 times; PT provides contact-guard assist to min assist Comments: Continued use of modeling, cuing for large amplitude/big effort, to increase step-length, hip flexion for stepping over object.  Patient exhibits hypokinesia of left lower extremity movement and upper extremity movement compared to right.  Hierarchy Tasks - performance and progress: Seated big reach 10x with each UE to each ipsilateral LE.  2. Walking on mat with focus on big step with turning 2x5 rounds  Gait - SBA-CGA provided due to festination.  PT provides cuing for big steps, big posture, big arm swing, and patient continues to require cueing 90% of time to override hypokinesia/bradykinesia. Pt requires continued cuing to sustain increased amplitude. Pt ambulated 592 feet on firm surfaces with some distractions d/t business of clinic.  Patient significantly fatigued by last lap with increased festination of gait, difficulty with dual motor task (arm swing) and required rest break after completion of lap.  With increased postural instability with last lap.   Carryover Assignment: Pt going to electronics store later with his computer. Instructed pt to use big reach when going to grab his computer.   Homework: Pt to perform all 7 LSVT BIG exercises, 5 Functional Component Tasks and big walking. Instructed to use support surface for functional component tasks (except for STS) on this date for safety. Updated carryover assignment.    Pt educated throughout session about proper posture and technique with exercises. Improved exercise technique, movement at target joints, use of target muscles after min to mod verbal, visual, tactile cues.   Note: Portions of this  document were prepared using Dragon voice recognition software and although reviewed may contain unintentional dictation errors in syntax, grammar, or spelling.    PT Short Term Goals - 12/17/20 1131       PT SHORT TERM GOAL #1   Title Pt will be independent with LSVT BIG HEP in order to improve functional mobility and balance to decrease fall risk and improve function at home.    Baseline 9/6: initiated    Time 3    Period Weeks    Status New    Target Date 01/02/21  PT Long Term Goals - 12/13/20 1202       PT LONG TERM GOAL #1   Title Patient will increase FOTO score to equal to or greater than 61 to demonstrate statistically significant improvement in mobility and quality of life.    Baseline 9/1: 52    Time 5    Period Weeks    Status New    Target Date 01/16/21      PT LONG TERM GOAL #2   Title Patient will tolerate 5 seconds of single leg stance without loss of balance to improve ability to step over obstacles/up and down curbs safely.    Baseline 9/1: 2-3 sec BLEs    Time 5    Period Weeks    Status New    Target Date 01/16/21      PT LONG TERM GOAL #3   Title Pt will improve DGI by at least 3 points in order to demonstrate clinically significant improvement in balance and decreased risk for falls so pt can safely go on walks outdoors with his wife.    Baseline 9/1: 18/24    Time 5    Period Weeks    Status New    Target Date 01/16/21      PT LONG TERM GOAL #4   Title Patient will increase 10 meter walk test to >1.27m/s as to improve gait speed for better community ambulation.    Baseline 9/1: 0.89 m/s    Time 5    Period Weeks    Status New    Target Date 01/16/21                   Plan - 12/18/20 1119     Clinical Impression Statement Continued performance of LSVT plan of care as laid out in examination.  PT reinforced importance of home exercise program and provided education on exercises frequency, sets, and reps and rest.   Patient verbalized understanding.  During gait training patient fatigued and exhibited festination and decreased ability to modulate gait speed.  The patient also appears to have greater difficulty with trunk rotation to the right the session.  He did show ability to self-correct for upper extremity supination and abduction.  The patient will benefit from further LSVT big treatment to improve bradykinesia and hypokinesia, impaired gait mechanics, and decreased active range of motion to improve ease and safety with ADLs.    Personal Factors and Comorbidities Age;Comorbidity 1;Comorbidity 2;Comorbidity 3+;Fitness;Time since onset of injury/illness/exacerbation    Comorbidities chronic lacunar infarct within L lentiform nucleus, hx of COVID19 (2020), HTN, duputyren contracture LUE 5th digit,    Examination-Activity Limitations Hygiene/Grooming;Bend;Stand;Sit;Locomotion Level;Transfers;Stairs;Dressing;Squat    Examination-Participation Restrictions Community Activity;Shop;Yard Work;Cleaning;Laundry    Stability/Clinical Decision Making Evolving/Moderate complexity    Rehab Potential Good    PT Frequency 4x / week    PT Duration Other (comment)   coverage for 5 weeks to cover the full 4 week program from evaluation onward   PT Treatment/Interventions ADLs/Self Care Home Management;DME Instruction;Gait training;Stair training;Functional mobility training;Therapeutic activities;Balance training;Therapeutic exercise;Neuromuscular re-education;Patient/family education;Orthotic Fit/Training;Manual techniques;Passive range of motion;Energy conservation;Taping;Splinting    PT Next Visit Plan Initiate maximal daily exercises, functional component task training, hirarchy task trianing, BIG walking, HEP/daiy carryover assignments    PT Home Exercise Plan 7 LSVT BIG exercises, 5 functional component tasks, big walking and carryover assignment (see note 9/6 for carryover). Handout/packet provided with additional written  instructions.  Updated carryover assignment on this date 12/18/2020    Consulted and  Agree with Plan of Care Patient              Patient will benefit from skilled therapeutic intervention in order to improve the following deficits and impairments:  Abnormal gait, Decreased activity tolerance, Decreased endurance, Decreased range of motion, Decreased knowledge of use of DME, Hypomobility, Improper body mechanics, Impaired UE functional use, Decreased balance, Decreased coordination, Decreased mobility, Difficulty walking, Impaired flexibility, Postural dysfunction  Visit Diagnosis: Other abnormalities of gait and mobility  Other lack of coordination  Muscle weakness (generalized)  Unsteadiness on feet  Parkinson disease (HCC)     Problem List There are no problems to display for this patient.  Temple Pacini PT, DPT 12/18/2020, 11:31 AM  [facility] MAIN [facility] SERVICES [address] [place], [place], [address] Phone: [phone]   Fax:  [phone]  Name: Kyle Conner MRN: [id number] Date of Birth: [birth year]       PT Short Term Goals - 12/17/20 1131       PT SHORT TERM GOAL #1   Title Pt will be independent with LSVT BIG HEP in order to improve functional mobility and balance to decrease fall risk and improve function at home.    Baseline 9/6: initiated    Time 3    Period Weeks    Status New    Target Date 01/02/21               PT Long Term Goals - 12/13/20 1202       PT LONG TERM GOAL #1   Title Patient will increase FOTO score to equal to or greater than 61 to demonstrate statistically significant improvement in mobility and quality of life.    Baseline 9/1: 52    Time 5    Period Weeks    Status New    Target Date 01/16/21      PT LONG TERM GOAL #2   Title Patient will tolerate 5 seconds of single leg stance without loss of balance to improve ability to step over obstacles/up and down curbs safely.     Baseline 9/1: 2-3 sec BLEs    Time 5    Period Weeks    Status New    Target Date 01/16/21      PT LONG TERM GOAL #3   Title Pt will improve DGI by at least 3 points in order to demonstrate clinically significant improvement in balance and decreased risk for falls so pt can safely go on walks outdoors with his wife.    Baseline 9/1: 18/24    Time 5    Period Weeks    Status New    Target Date 01/16/21      PT LONG TERM GOAL #4   Title Patient will increase 10 meter walk test to >1.44m/s as to improve gait speed for better community ambulation.    Baseline 9/1: 0.89 m/s    Time 5    Period Weeks    Status New    Target Date 01/16/21                   Plan - 12/18/20 1119     Clinical Impression Statement Continued performance of LSVT plan of care as laid out in examination.  PT reinforced importance of home exercise program and provided education on exercises frequency, sets, and reps and rest.  Patient verbalized understanding.  During gait training patient fatigued and exhibited festination and decreased ability to modulate  gait speed.  The patient also appears to have greater difficulty with trunk rotation to the right the session.  He did show ability to self-correct for upper extremity supination and abduction.  The patient will benefit from further LSVT big treatment to improve bradykinesia and hypokinesia, impaired gait mechanics, and decreased active range of motion to improve ease and safety with ADLs.    Personal Factors and Comorbidities Age;Comorbidity 1;Comorbidity 2;Comorbidity 3+;Fitness;Time since onset of injury/illness/exacerbation    Comorbidities chronic lacunar infarct within L lentiform nucleus, hx of COVID19 (2020), HTN, duputyren contracture LUE 5th digit,    Examination-Activity Limitations Hygiene/Grooming;Bend;Stand;Sit;Locomotion Level;Transfers;Stairs;Dressing;Squat    Examination-Participation Restrictions Community Activity;Shop;Yard  Work;Cleaning;Laundry    Stability/Clinical Decision Making Evolving/Moderate complexity    Rehab Potential Good    PT Frequency 4x / week    PT Duration Other (comment)   coverage for 5 weeks to cover the full 4 week program from evaluation onward   PT Treatment/Interventions ADLs/Self Care Home Management;DME Instruction;Gait training;Stair training;Functional mobility training;Therapeutic activities;Balance training;Therapeutic exercise;Neuromuscular re-education;Patient/family education;Orthotic Fit/Training;Manual techniques;Passive range of motion;Energy conservation;Taping;Splinting    PT Next Visit Plan Initiate maximal daily exercises, functional component task training, hirarchy task trianing, BIG walking, HEP/daiy carryover assignments    PT Home Exercise Plan 7 LSVT BIG exercises, 5 functional component tasks, big walking and carryover assignment (see note 9/6 for carryover). Handout/packet provided with additional written instructions.  Updated carryover assignment on this date 12/18/2020    Consulted and Agree with Plan of Care Patient             Patient will benefit from skilled therapeutic intervention in order to improve the following deficits and impairments:  Abnormal gait, Decreased activity tolerance, Decreased endurance, Decreased range of motion, Decreased knowledge of use of DME, Hypomobility, Improper body mechanics, Impaired UE functional use, Decreased balance, Decreased coordination, Decreased mobility, Difficulty walking, Impaired flexibility, Postural dysfunction  Visit Diagnosis: Other abnormalities of gait and mobility  Other lack of coordination  Muscle weakness (generalized)  Unsteadiness on feet  Parkinson disease (HCC)     Problem List There are no problems to display for this patient.   Baird Kay, PT 12/18/2020, 11:31 AM  [place] [facility] MAIN [facility] SERVICES [address] [place], [place], [address] Phone:  [phone]   Fax:  [phone]  Name: Kyle Conner MRN: [id number] Date of Birth: 05-18-1950

## 2020-12-19 ENCOUNTER — Other Ambulatory Visit: Payer: Self-pay

## 2020-12-19 ENCOUNTER — Ambulatory Visit: Payer: Medicare Other

## 2020-12-19 DIAGNOSIS — G20 Parkinson's disease: Secondary | ICD-10-CM | POA: Diagnosis not present

## 2020-12-19 DIAGNOSIS — R2681 Unsteadiness on feet: Secondary | ICD-10-CM

## 2020-12-19 DIAGNOSIS — R2689 Other abnormalities of gait and mobility: Secondary | ICD-10-CM

## 2020-12-19 DIAGNOSIS — R278 Other lack of coordination: Secondary | ICD-10-CM

## 2020-12-19 NOTE — Therapy (Signed)
[facility] MAIN [facility] SERVICES [address] [place], [place], [address] Phone: [phone]   Fax:  [phone]  Physical Therapy Treatment  Patient Details  Name: Kyle Conner MRN: [id number] Date of Birth: 03/17/1951 Referring Provider (PT): Lonell Face, MD   Encounter Date: 12/19/2020   PT End of Session - 12/19/20 1138     Visit Number 4    Number of Visits 17    Date for PT Re-Evaluation 01/16/21    Authorization Type LSVT BIG, eval performed 12/12/2020    Authorization Time Period 12/12/2020-01/16/2021    Authorization - Visit Number 1    Authorization - Number of Visits 17    PT Start Time 1016    PT Stop Time 1113    PT Time Calculation (min) 57 min    Equipment Utilized During Treatment Gait belt    Activity Tolerance Patient tolerated treatment well;Patient limited by fatigue    Behavior During Therapy [facility] for tasks assessed/performed             History reviewed. No pertinent past medical history.  History reviewed. No pertinent surgical history.  There were no vitals filed for this visit.   Subjective Assessment - 12/19/20 1016     Subjective Pt reports performing HEP. Pt reports he is not yet fully comfortable with movements on his own.    Pertinent History Pt is a pleasant 70 y/o male presenting to PT for LSVT BIG evaluation. Pt reports onset of Parkinson's sx occured over 3 years ago with noticing initial changes in gait and tremor of L hand. Pt reports gradual worsening of gait with shuffling, festination. Denies freezing episodes. Reports one fall over six months ago d/t gait festination. Pt also reports feelings of "stiffness" impacting his ability to perform transfers. He reports FOF with ambulating and ascending/descending steps. Pt does not use an AD, but does express that gait is his greatest concern, where he is unable to ambulate for long periods of time and has difficulty with ambulating over uneven,  compliant surfaces and up/down hills. Per chart other PMH is significant for: chronic lacunar infarct within L lentiform nucleus, sleep apnea (uses CPAP), Dupuytren Contracture LUE 5th digit, mild visual hallucinations, COVID (2020), uses hearing aids, blockage of coronary artery of heart, h/o cardiac cath (2008), HLD, HTN.    Limitations Walking;Writing;Standing;Sitting;House hold activities    How long can you sit comfortably? Pt reports >1 hr if chair is padded; pt reports if hard chair he is limited to 30 min d/t discomfort from numbness in LEs    How long can you stand comfortably? Pt reports if he is fatigued he is only able to stand 30-45 min.    How long can you walk comfortably? 50 yards    Diagnostic tests imging 09/05/2020 MR brain WO contrast: "IMPRESSION:  No evidence of acute intracranial abnormality.  Moderate generalized cerebral atrophy and cerebral white matter  chronic small vessel ischemic disease, progressed from the head CT  of 11/09/2009.  Small chronic lacunar infarct within the left lentiform nucleus.  Comparatively mild cerebellar atrophy.  Minimal right maxillary sinus mucosal thickening."    Patient Stated Goals Would like to walk without shuffling    Currently in Pain? No/denies             LSVT BIG Treatment:  Patient seen for LSVT Daily Session Maximal Daily Exercises for facilitation/coordination of movement Sustained movements are designed to rescale the amplitude of movement output  for generalization to daily functional activities.  Maximal Daily Exercises: Floor to Ceiling 10 with 10 second hold Side to Side 8 Bilateral with 10 second hold  Forward Step and Reach 10 Bilateral Sideways Step and Reach 10 Bilateral Backwards Step and Reach 10 Bilateral Forwards Rock and Reach 10 Bilateral Sideways Rock and Reach 10 Bilateral  Maximal Daily Exercise Comments: Difficulty with B shoulder extension extension, PT able to somewhat reduce cues. Pt reports stiffness  in B shoulders. Pt requires rest after backwards step and reach (did not use inhaler prior to session today, more fatigued than usual). Pt overall does exhibit improved technique/increased fluidity of movement compared to prior sessions.   Functional Component Tasks - performance and progress: 5 reps for each, and 5 reps for each LE with tasks 2-5. Sit to Stand x 5 from standard chair, pt requires less cuing, but still is cued for finger abduction Kneeling to stand with UE support  3. Big step over object; cueing for increased step height and length bilaterally, cuing to increase backwards step over object as well (pt taking multiple small steps). Pt requires rest break following reps.  4. Walk and Turn (around object); improved consistency with large steps 5. Walk on compliant surface; Pt with improved consistency of larger amplitude step-length Comments: Pt improved ability to override hypokinesia   Hierarchy Tasks - performance and progress: Seated big reach 10x with each UE to each ipsilateral LE. Pt able to more consistently reach shoes.  2. Walking on mat with ankle weights underneath for increased uneven surface, focus on big step with turning 2x5 rounds, mild decrease in postural stability, pt performs forward stepping, backward stepping, side-stepping  Gait - SBA-CGA provided due to festination. Pt r PT provides cuing for big steps, big posture, big arm swing. Patient continues to require cueing 85-90% of time to override hypokinesia/bradykinesia. He demonstrates increased fatigue today and more instances of festinating gait, difficulty sustaining amplitude of B arm swing. Requires rest break after 3rd lap. Ambulates 592 total feet, firm surface with some distractions d/t business of clinic.  Pt with increased postural instability with last lap.   Carryover Assignment: Instructed pt to open a door with big effort.   Homework: Pt to perform all 7 LSVT BIG exercises, 5 Functional  Component Tasks and big walking. Instructed to use support surface for functional component tasks (except for STS) on this date for safety. Updated carryover assignment.    Pt educated throughout session about proper posture and technique with exercises. Improved exercise technique, movement at target joints, use of target muscles after min to mod verbal, visual, tactile cues.   Note: Portions of this document were prepared using Dragon voice recognition software and although reviewed may contain unintentional dictation errors in syntax, grammar, or spelling.     PT Education - 12/19/20 1138     Education Details Further cuing for exercise technique, body mechanics. Instructed pt in new carryover assignment (see note for details)    Person(s) Educated Patient    Methods Explanation;Demonstration;Tactile cues;Verbal cues;Handout    Comprehension Verbalized understanding;Returned demonstration;Verbal cues required;Tactile cues required;Need further instruction              PT Short Term Goals - 12/17/20 1131       PT SHORT TERM GOAL #1   Title Pt will be independent with LSVT BIG HEP in order to improve functional mobility and balance to decrease fall risk and improve function at home.    Baseline 9/6: initiated  Time 3    Period Weeks    Status New    Target Date 01/02/21               PT Long Term Goals - 12/13/20 1202       PT LONG TERM GOAL #1   Title Patient will increase FOTO score to equal to or greater than 61 to demonstrate statistically significant improvement in mobility and quality of life.    Baseline 9/1: 52    Time 5    Period Weeks    Status New    Target Date 01/16/21      PT LONG TERM GOAL #2   Title Patient will tolerate 5 seconds of single leg stance without loss of balance to improve ability to step over obstacles/up and down curbs safely.    Baseline 9/1: 2-3 sec BLEs    Time 5    Period Weeks    Status New    Target Date 01/16/21       PT LONG TERM GOAL #3   Title Pt will improve DGI by at least 3 points in order to demonstrate clinically significant improvement in balance and decreased risk for falls so pt can safely go on walks outdoors with his wife.    Baseline 9/1: 18/24    Time 5    Period Weeks    Status New    Target Date 01/16/21      PT LONG TERM GOAL #4   Title Patient will increase 10 meter walk test to >1.35m/s as to improve gait speed for better community ambulation.    Baseline 9/1: 0.89 m/s    Time 5    Period Weeks    Status New    Target Date 01/16/21                   Plan - 12/19/20 1139     Clinical Impression Statement Pt shows improved technique/amplitude of movement with floor to ceiling, forward step and reach, and STS exercises, so PT able to reduced amount of TC/VC used. Pt rates majority of Maximal Daily Exercises as easy. While pt shows improvement, he is still most challenged with rotational movements, and backwards step and reach. He is challenged with UE shoulder extension and UE supination AROM. Pt had not taken inhaler prior to PT session so he reports increased fatigue compared to usual and required more frequent rest breaks. Otherwise, pt tolerated treatment well. Pt will benefit from further skilled LSVT BIG treatments to improve bradykinesia and hypokinesia in order to improve AROM, gait mechanics and ease and safety wtih ADLs.    Personal Factors and Comorbidities Age;Comorbidity 1;Comorbidity 2;Comorbidity 3+;Fitness;Time since onset of injury/illness/exacerbation    Comorbidities chronic lacunar infarct within L lentiform nucleus, hx of COVID19 (2020), HTN, duputyren contracture LUE 5th digit,    Examination-Activity Limitations Hygiene/Grooming;Bend;Stand;Sit;Locomotion Level;Transfers;Stairs;Dressing;Squat    Examination-Participation Restrictions Community Activity;Shop;Yard Work;Cleaning;Laundry    Stability/Clinical Decision Making Evolving/Moderate complexity    Rehab  Potential Good    PT Frequency 4x / week    PT Duration Other (comment)   coverage for 5 weeks to cover the full 4 week program from evaluation onward   PT Treatment/Interventions ADLs/Self Care Home Management;DME Instruction;Gait training;Stair training;Functional mobility training;Therapeutic activities;Balance training;Therapeutic exercise;Neuromuscular re-education;Patient/family education;Orthotic Fit/Training;Manual techniques;Passive range of motion;Energy conservation;Taping;Splinting    PT Next Visit Plan Initiate maximal daily exercises, functional component task training, hirarchy task trianing, BIG walking, HEP/daiy carryover assignments    PT Home Exercise  Plan 7 LSVT BIG exercises, 5 functional component tasks, big walking and carryover assignment (see note 9/6 for carryover). Handout/packet provided with additional written instructions.  Updated carryover assignment on this date 12/19/2020 (see note)    Consulted and Agree with Plan of Care Patient             Patient will benefit from skilled therapeutic intervention in order to improve the following deficits and impairments:  Abnormal gait, Decreased activity tolerance, Decreased endurance, Decreased range of motion, Decreased knowledge of use of DME, Hypomobility, Improper body mechanics, Impaired UE functional use, Decreased balance, Decreased coordination, Decreased mobility, Difficulty walking, Impaired flexibility, Postural dysfunction  Visit Diagnosis: Other abnormalities of gait and mobility  Other lack of coordination  Unsteadiness on feet  Parkinson disease (HCC)     Problem List There are no problems to display for this patient.   Baird Kay, PT 12/19/2020, 5:16 PM  [facility] MAIN [facility] SERVICES [address] [place], [place], [address] Phone: [phone]   Fax:  [phone]  Name: Kyle Conner MRN: [id number] Date of Birth: [birth year]

## 2020-12-20 ENCOUNTER — Ambulatory Visit: Payer: Medicare Other

## 2020-12-20 DIAGNOSIS — G20 Parkinson's disease: Secondary | ICD-10-CM | POA: Diagnosis not present

## 2020-12-20 DIAGNOSIS — R2681 Unsteadiness on feet: Secondary | ICD-10-CM

## 2020-12-20 DIAGNOSIS — R2689 Other abnormalities of gait and mobility: Secondary | ICD-10-CM

## 2020-12-20 DIAGNOSIS — R278 Other lack of coordination: Secondary | ICD-10-CM

## 2020-12-20 NOTE — Therapy (Signed)
[facility] MAIN [facility] SERVICES [address] [place], [place], [address] Phone: [phone]   Fax:  [phone]  Physical Therapy Treatment  Patient Details  Name: Kyle Conner MRN: [id number] Date of Birth: [birth year] Referring Provider (PT): Lonell Face, MD   Encounter Date: 12/20/2020   PT End of Session - 12/20/20 1123     Visit Number 5    Number of Visits 17    Date for PT Re-Evaluation 01/16/21    Authorization Type LSVT BIG, eval performed 12/12/2020    Authorization Time Period 12/12/2020-01/16/2021    Authorization - Visit Number 1    Authorization - Number of Visits 17    PT Start Time 1016    PT Stop Time 1118    PT Time Calculation (min) 62 min    Equipment Utilized During Treatment Gait belt    Activity Tolerance Patient tolerated treatment well    Behavior During Therapy [facility] for tasks assessed/performed             History reviewed. No pertinent past medical history.  History reviewed. No pertinent surgical history.  There were no vitals filed for this visit.   Subjective Assessment - 12/20/20 1122     Subjective Pt reports some R hip pain. Pt reports no falls or near falls. Did part of HEP (carryover assignment), pt says he felt too wiped out to do other exercises.    Pertinent History Pt is a pleasant 70 y/o male presenting to PT for LSVT BIG evaluation. Pt reports onset of Parkinson's sx occured over 3 years ago with noticing initial changes in gait and tremor of L hand. Pt reports gradual worsening of gait with shuffling, festination. Denies freezing episodes. Reports one fall over six months ago d/t gait festination. Pt also reports feelings of "stiffness" impacting his ability to perform transfers. He reports FOF with ambulating and ascending/descending steps. Pt does not use an AD, but does express that gait is his greatest concern, where he is unable to ambulate for long periods of time and has difficulty  with ambulating over uneven, compliant surfaces and up/down hills. Per chart other PMH is significant for: chronic lacunar infarct within L lentiform nucleus, sleep apnea (uses CPAP), Dupuytren Contracture LUE 5th digit, mild visual hallucinations, COVID (2020), uses hearing aids, blockage of coronary artery of heart, h/o cardiac cath (2008), HLD, HTN.    Limitations Walking;Writing;Standing;Sitting;House hold activities    How long can you sit comfortably? Pt reports >1 hr if chair is padded; pt reports if hard chair he is limited to 30 min d/t discomfort from numbness in LEs    How long can you stand comfortably? Pt reports if he is fatigued he is only able to stand 30-45 min.    How long can you walk comfortably? 50 yards    Diagnostic tests imging 09/05/2020 MR brain WO contrast: "IMPRESSION:  No evidence of acute intracranial abnormality.  Moderate generalized cerebral atrophy and cerebral white matter  chronic small vessel ischemic disease, progressed from the head CT  of 11/09/2009.  Small chronic lacunar infarct within the left lentiform nucleus.  Comparatively mild cerebellar atrophy.  Minimal right maxillary sinus mucosal thickening."    Patient Stated Goals Would like to walk without shuffling    Currently in Pain? Yes    Pain Location Hip    Pain Orientation Right              LSVT BIG Treatment:  Patient  seen for LSVT Daily Session Maximal Daily Exercises for facilitation/coordination of movement Sustained movements are designed to rescale the amplitude of movement output for generalization to daily functional activities.  Maximal Daily Exercises: Floor to Ceiling 10 with 10 second hold Side to Side 10 Bilateral with 10 second hold  Forward Step and Reach 10 Bilateral Sideways Step and Reach 10 Bilateral Backwards Step and Reach 10 Bilateral Forwards Rock and Reach 10 Bilateral Sideways Rock and Reach 10 Bilateral  Maximal Daily Exercise Comments:   Functional Component  Tasks - performance and progress: 5 reps for each, and 5 reps for each LE with tasks 2-5. Sit to Stand x 5 from standard chair - performed two additional reps to self-correct technique Kneeling lunge to stand - missed exercise today 3. Big step over object; performs two additional reps to self-correct technique, consistently able to demonstrate increased amplitude B 3. Walk and Turn (around object); improved consistency with large steps; consistently takes large steps,requires minimal cues for correct technique 4. Walk on compliant surface; consistently takes 5 big steps each LE, improved postural stability compared to prior session Comments: Continues to require less cues with functional component tasks to sustain larger amplitude movements and big effort. Shows improved postural stability with compliant surface stepping and with turning.   Hierarchy Tasks - performance and progress: Seated big reach 10x with each UE to each ipsilateral LE. Able to consistently reach feet.  2. Walking on mat with ankle weights underneath for increased uneven surface, focus on big step with turning - forward stepping, backward stepping, side-stepping, turns. Only a couple instances of mild postural instability. Improvement in step-length consistency.   Gait - SBA-CGA provided throughout due to festination/mild postural instability at times. Addition of cones placed in each UE for last lap for kinesthetic awareness/cue to increase arm-swing.  PT continues to provide cuing for big steps, big posture, big arm swing. Patient continues to require cueing 85-90% of time to override hypokinesia/bradykinesia.  Ambulates approximately 592 total feet, firm surface with limited distractions.  Pt with improved endurance/activity tolerance compared to prior session.  Carryover Assignment: Big posture with big greeting when his daughter arrives for visit this weekend.    Homework: Pt to perform all 7 LSVT BIG exercises, 5  Functional Component Tasks and big walking. Instructed to use support surface for functional component tasks (except for STS) on this date for safety. Updated carryover assignment.    Pt educated throughout session about proper posture and technique with exercises. Improved exercise technique, movement at target joints, use of target muscles after min to mod verbal, visual, tactile cues.           PT Education - 12/20/20 1123     Education Details exercise technique, body mechanics with Maximal Daily Exercises, functional component tasks, heirarchy exercises    Person(s) Educated Patient    Methods Explanation;Demonstration;Tactile cues;Verbal cues    Comprehension Verbalized understanding;Returned demonstration;Tactile cues required;Need further instruction              PT Short Term Goals - 12/17/20 1131       PT SHORT TERM GOAL #1   Title Pt will be independent with LSVT BIG HEP in order to improve functional mobility and balance to decrease fall risk and improve function at home.    Baseline 9/6: initiated    Time 3    Period Weeks    Status New    Target Date 01/02/21  PT Long Term Goals - 12/13/20 1202       PT LONG TERM GOAL #1   Title Patient will increase FOTO score to equal to or greater than 61 to demonstrate statistically significant improvement in mobility and quality of life.    Baseline 9/1: 52    Time 5    Period Weeks    Status New    Target Date 01/16/21      PT LONG TERM GOAL #2   Title Patient will tolerate 5 seconds of single leg stance without loss of balance to improve ability to step over obstacles/up and down curbs safely.    Baseline 9/1: 2-3 sec BLEs    Time 5    Period Weeks    Status New    Target Date 01/16/21      PT LONG TERM GOAL #3   Title Pt will improve DGI by at least 3 points in order to demonstrate clinically significant improvement in balance and decreased risk for falls so pt can safely go on walks  outdoors with his wife.    Baseline 9/1: 18/24    Time 5    Period Weeks    Status New    Target Date 01/16/21      PT LONG TERM GOAL #4   Title Patient will increase 10 meter walk test to >1.7857m/s as to improve gait speed for better community ambulation.    Baseline 9/1: 0.89 m/s    Time 5    Period Weeks    Status New    Target Date 01/16/21                   Plan - 12/20/20 1124     Clinical Impression Statement Pt with improved ability to sustain large-amplitude UE shoulder abduction and flexion with Maximal Daily Exercises. Pt was also able to reach his feet 100% of the time with Hierarchy Task to override bradykinetic movement with bending and reaching. Pt still most challenged with shoulder extension as well as finger abduction bilateraly, particularly on L side. This becomes apparent as well with gait training with L UE arm-swing, where pt struggles to sustain large amplitude armswing with increased fatigue. The pt will benefit from futher skilled LSVT BIG PT treatments to continue to improve bradykinetic and hypokinetic movement as well as gait mechanics and balance to increase ease and safety with ADLs.    Personal Factors and Comorbidities Age;Comorbidity 1;Comorbidity 2;Comorbidity 3+;Fitness;Time since onset of injury/illness/exacerbation    Comorbidities chronic lacunar infarct within L lentiform nucleus, hx of COVID19 (2020), HTN, duputyren contracture LUE 5th digit,    Examination-Activity Limitations Hygiene/Grooming;Bend;Stand;Sit;Locomotion Level;Transfers;Stairs;Dressing;Squat    Examination-Participation Restrictions Community Activity;Shop;Yard Work;Cleaning;Laundry    Stability/Clinical Decision Making Evolving/Moderate complexity    Rehab Potential Good    PT Frequency 4x / week    PT Duration Other (comment)   coverage for 5 weeks to cover the full 4 week program from evaluation onward   PT Treatment/Interventions ADLs/Self Care Home Management;DME  Instruction;Gait training;Stair training;Functional mobility training;Therapeutic activities;Balance training;Therapeutic exercise;Neuromuscular re-education;Patient/family education;Orthotic Fit/Training;Manual techniques;Passive range of motion;Energy conservation;Taping;Splinting    PT Next Visit Plan Initiate maximal daily exercises, functional component task training, hirarchy task trianing, BIG walking, HEP/daiy carryover assignments    PT Home Exercise Plan 7 LSVT BIG exercises, 5 functional component tasks, big walking and carryover assignment (see note 9/6 for carryover). Handout/packet provided with additional written instructions.  Updated carryover assignment on this date 12/20/2020 (see note)  Consulted and Agree with Plan of Care Patient             Patient will benefit from skilled therapeutic intervention in order to improve the following deficits and impairments:  Abnormal gait, Decreased activity tolerance, Decreased endurance, Decreased range of motion, Decreased knowledge of use of DME, Hypomobility, Improper body mechanics, Impaired UE functional use, Decreased balance, Decreased coordination, Decreased mobility, Difficulty walking, Impaired flexibility, Postural dysfunction  Visit Diagnosis: Other abnormalities of gait and mobility  Other lack of coordination  Unsteadiness on feet     Problem List There are no problems to display for this patient.   Baird Kay, PT 12/20/2020, 11:36 AM  [facility] MAIN [facility] SERVICES [address] [place], [place], [address] Phone: [phone]   Fax:  [phone]  Name: Jonmarc Bodkin MRN: [id number] Date of Birth: 03/28/1951

## 2020-12-23 ENCOUNTER — Ambulatory Visit: Payer: Medicare Other

## 2020-12-24 ENCOUNTER — Other Ambulatory Visit: Payer: Self-pay

## 2020-12-24 ENCOUNTER — Ambulatory Visit: Payer: Medicare Other

## 2020-12-24 DIAGNOSIS — R2681 Unsteadiness on feet: Secondary | ICD-10-CM

## 2020-12-24 DIAGNOSIS — R2689 Other abnormalities of gait and mobility: Secondary | ICD-10-CM

## 2020-12-24 DIAGNOSIS — G20 Parkinson's disease: Secondary | ICD-10-CM | POA: Diagnosis not present

## 2020-12-24 DIAGNOSIS — R278 Other lack of coordination: Secondary | ICD-10-CM

## 2020-12-24 NOTE — Therapy (Signed)
[facility] MAIN [facility] SERVICES [address] [place], [place], [address] Phone: [phone]   Fax:  [phone]  Physical Therapy Treatment  Patient Details  Name: Kyle Conner MRN: [id number] Date of Birth: [birth year] Referring Provider (PT): Lonell Face, MD   Encounter Date: 12/24/2020   PT End of Session - 12/24/20 1133     Visit Number 6    Number of Visits 17    Date for PT Re-Evaluation 01/16/21    Authorization Type LSVT BIG, eval performed 12/12/2020    Authorization Time Period 12/12/2020-01/16/2021    Authorization - Visit Number 1    Authorization - Number of Visits 17    PT Start Time 1019    PT Stop Time 1113    PT Time Calculation (min) 54 min    Equipment Utilized During Treatment Gait belt    Activity Tolerance Patient tolerated treatment well;No increased pain;Patient limited by fatigue    Behavior During Therapy [facility] for tasks assessed/performed             History reviewed. No pertinent past medical history.  History reviewed. No pertinent surgical history.  There were no vitals filed for this visit.   Subjective Assessment - 12/24/20 1021     Subjective Pt reports performing some of his HEP, but not carryover assignment. Pt reports exercises seem to come more naturally.    Pertinent History Pt is a pleasant 70 y/o male presenting to PT for LSVT BIG evaluation. Pt reports onset of Parkinson's sx occured over 3 years ago with noticing initial changes in gait and tremor of L hand. Pt reports gradual worsening of gait with shuffling, festination. Denies freezing episodes. Reports one fall over six months ago d/t gait festination. Pt also reports feelings of "stiffness" impacting his ability to perform transfers. He reports FOF with ambulating and ascending/descending steps. Pt does not use an AD, but does express that gait is his greatest concern, where he is unable to ambulate for long periods of time and has  difficulty with ambulating over uneven, compliant surfaces and up/down hills. Per chart other PMH is significant for: chronic lacunar infarct within L lentiform nucleus, sleep apnea (uses CPAP), Dupuytren Contracture LUE 5th digit, mild visual hallucinations, COVID (2020), uses hearing aids, blockage of coronary artery of heart, h/o cardiac cath (2008), HLD, HTN.    Limitations Walking;Writing;Standing;Sitting;House hold activities    How long can you sit comfortably? Pt reports >1 hr if chair is padded; pt reports if hard chair he is limited to 30 min d/t discomfort from numbness in LEs    How long can you stand comfortably? Pt reports if he is fatigued he is only able to stand 30-45 min.    How long can you walk comfortably? 50 yards    Diagnostic tests imging 09/05/2020 MR brain WO contrast: "IMPRESSION:  No evidence of acute intracranial abnormality.  Moderate generalized cerebral atrophy and cerebral white matter  chronic small vessel ischemic disease, progressed from the head CT  of 11/09/2009.  Small chronic lacunar infarct within the left lentiform nucleus.  Comparatively mild cerebellar atrophy.  Minimal right maxillary sinus mucosal thickening."    Patient Stated Goals Would like to walk without shuffling    Currently in Pain? Yes    Pain Location Hip    Pain Orientation Right               LSVT BIG Treatment:   Patient seen for LSVT Daily  Session Maximal Daily Exercises for facilitation/coordination of movement Sustained movements are designed to rescale the amplitude of movement output for generalization to daily functional activities.   Maximal Daily Exercises: Progressed all exercises to being performed with patient wearing 1.5# weights on B wrists Floor to Ceiling 8 with 10 second hold  Side to Side 8 Bilateral with 10 second hold  Forward Step and Reach 10 Bilateral Sideways Step and Reach 10 Bilateral - cuing for R shoulder ext/abd Backwards Step and Reach 10 Bilateral -  cuing 3x for L shoulder extension Forwards Rock and Reach 10 Bilateral Sideways Rock and Reach 10 Bilateral   Maximal Daily Exercise Comments: Overall improvement in speed and amplitude of movement. Pt requires less cuing. While shoulder extension has improved, this is main area of difficulty for pt.    Functional Component Tasks - performance and progress: 5 reps for each Sit to Stand x 5 from standard chair  Kneeling lunge to stand  3. Big step over object 4. Walk and Turn (around object);  5. Walk on compliant surface Comments: Pt performs all STS reps with good technique, demonstrating increased speed and amplitude/effort of movement. Pt fully clears object with stepping exercise. With walking on compliant surface there was no evidence of postural instability. Pt did require some cuing with walking and turn for sequencing.   Hierarchy Tasks - performance and progress: Seated big reach 5x with each UE to each ipsilateral LE followed by big leg lift and cross onto contralteral knee (5x each); pt with greater difficulty lifting LLE into FABER position, but is consistently able to reach B feet with bend and reach.  2. Walking up and down slope with 1.5# weights on wrists. Pt requires rest break following task d/t fatigue/decrease in gait mechanics. Slight postural instability with reps as pt fatigued. Close CGA provided.     Gait - with 1.5# weights on wrists, SBA-CGA provided throughout due to festination/mild postural instability at times as pt fatigued.  PT continues to provide cuing for big steps, big posture, big arm swing, particularly extension approx 60-80% of time to override hypokinesia/bradykinesia (mainly exhibited with LUE/LLE).  Pt ambulates >544 feet, on firm surface with mod. Distractions d/t business of clinic.     Carryover Assignment: Big walk and turn when in Insurance account manager.      Homework: Pt to perform all 7 LSVT BIG exercises, 5 Functional Component Tasks and big  walking. Instructed to use support surface for functional component tasks (except for STS) on this date for safety. Updated carryover assignment.      Pt educated throughout session about proper posture and technique with exercises. Improved exercise technique, movement at target joints, use of target muscles after min to mod verbal, visual, tactile cues.   Note: Portions of this document were prepared using Dragon voice recognition software and although reviewed may contain unintentional dictation errors in syntax, grammar, or spelling.      PT Education - 12/24/20 1132     Education Details Exercise technique, body mechanics with progression of maximal daily exercises, heirarchy tasks, carryover assignment.    Person(s) Educated Patient    Methods Explanation;Demonstration;Tactile cues;Verbal cues;Handout    Comprehension Verbalized understanding;Returned demonstration;Verbal cues required;Tactile cues required;Need further instruction              PT Short Term Goals - 12/17/20 1131       PT SHORT TERM GOAL #1   Title Pt will be independent with LSVT BIG HEP in order to  improve functional mobility and balance to decrease fall risk and improve function at home.    Baseline 9/6: initiated    Time 3    Period Weeks    Status New    Target Date 01/02/21               PT Long Term Goals - 12/13/20 1202       PT LONG TERM GOAL #1   Title Patient will increase FOTO score to equal to or greater than 61 to demonstrate statistically significant improvement in mobility and quality of life.    Baseline 9/1: 52    Time 5    Period Weeks    Status New    Target Date 01/16/21      PT LONG TERM GOAL #2   Title Patient will tolerate 5 seconds of single leg stance without loss of balance to improve ability to step over obstacles/up and down curbs safely.    Baseline 9/1: 2-3 sec BLEs    Time 5    Period Weeks    Status New    Target Date 01/16/21      PT LONG TERM GOAL #3    Title Pt will improve DGI by at least 3 points in order to demonstrate clinically significant improvement in balance and decreased risk for falls so pt can safely go on walks outdoors with his wife.    Baseline 9/1: 18/24    Time 5    Period Weeks    Status New    Target Date 01/16/21      PT LONG TERM GOAL #4   Title Patient will increase 10 meter walk test to >1.9m/s as to improve gait speed for better community ambulation.    Baseline 9/1: 0.89 m/s    Time 5    Period Weeks    Status New    Target Date 01/16/21                   Plan - 12/24/20 1134     Clinical Impression Statement Patient able to perform progression of maximal daily exercises with addition of 1.5 pound ankle weights on wrists.  Patient demonstrates improvement with bradykinetic and hypokinetic movement by performing intervensions with greater speed and amplitude, requiring less cuing from therapist. Pt still fatigues quickly with gait training, requiring rest breaks d/t festinating gait. After brief rest the pt is able to improve both B arm swing and step length, but does have difficulty with sustaining both simultaneously when fatigued. Last, another area of improvement is the pt has increased L shoulder extension with gait and maximal daily exercises.  The patient will benefit from further skilled LSVT BIG PT treatments to continue to improve hypokentic and bradykinetic movement in order to improve mobility, balance, and gait mechanics.    Personal Factors and Comorbidities Age;Comorbidity 1;Comorbidity 2;Comorbidity 3+;Fitness;Time since onset of injury/illness/exacerbation    Comorbidities chronic lacunar infarct within L lentiform nucleus, hx of COVID19 (2020), HTN, duputyren contracture LUE 5th digit,    Examination-Activity Limitations Hygiene/Grooming;Bend;Stand;Sit;Locomotion Level;Transfers;Stairs;Dressing;Squat    Examination-Participation Restrictions Community Activity;Shop;Yard  Work;Cleaning;Laundry    Stability/Clinical Decision Making Evolving/Moderate complexity    Rehab Potential Good    PT Frequency 4x / week    PT Duration Other (comment)   coverage for 5 weeks to cover the full 4 week program from evaluation onward   PT Treatment/Interventions ADLs/Self Care Home Management;DME Instruction;Gait training;Stair training;Functional mobility training;Therapeutic activities;Balance training;Therapeutic exercise;Neuromuscular re-education;Patient/family education;Orthotic Fit/Training;Manual techniques;Passive range  of motion;Energy conservation;Taping;Splinting    PT Next Visit Plan Initiate maximal daily exercises, functional component task training, hirarchy task trianing, BIG walking, HEP/daiy carryover assignments    PT Home Exercise Plan 7 LSVT BIG exercises, 5 functional component tasks, big walking and carryover assignment (see note 9/6 for carryover). Handout/packet provided with additional written instructions.  Updated carryover assignment on this date 12/24/2020 (see note)    Consulted and Agree with Plan of Care Patient             Patient will benefit from skilled therapeutic intervention in order to improve the following deficits and impairments:  Abnormal gait, Decreased activity tolerance, Decreased endurance, Decreased range of motion, Decreased knowledge of use of DME, Hypomobility, Improper body mechanics, Impaired UE functional use, Decreased balance, Decreased coordination, Decreased mobility, Difficulty walking, Impaired flexibility, Postural dysfunction  Visit Diagnosis: Other abnormalities of gait and mobility  Other lack of coordination  Unsteadiness on feet  Parkinson disease (HCC)     Problem List There are no problems to display for this patient.   Baird Kay, PT 12/24/2020, 11:48 AM  [facility] MAIN [facility] SERVICES [address] [place], [place], [address] Phone: [phone]   Fax:   [phone]  Name: Kyle Conner MRN: [id number] Date of Birth: [birth year]

## 2020-12-25 ENCOUNTER — Ambulatory Visit: Payer: Medicare Other

## 2020-12-26 ENCOUNTER — Ambulatory Visit: Payer: Medicare Other

## 2020-12-26 ENCOUNTER — Other Ambulatory Visit: Payer: Self-pay

## 2020-12-26 DIAGNOSIS — R2689 Other abnormalities of gait and mobility: Secondary | ICD-10-CM

## 2020-12-26 DIAGNOSIS — G20 Parkinson's disease: Secondary | ICD-10-CM

## 2020-12-26 DIAGNOSIS — R2681 Unsteadiness on feet: Secondary | ICD-10-CM

## 2020-12-26 DIAGNOSIS — R278 Other lack of coordination: Secondary | ICD-10-CM

## 2020-12-26 NOTE — Therapy (Signed)
[facility] MAIN [facility] SERVICES [address] [place], [place], [address] Phone: [phone]   Fax:  [phone]  Physical Therapy Treatment  Patient Details  Name: Kyle Conner MRN: [id number] Date of Birth: [birth year] Referring Provider (PT): Lonell Face, MD   Encounter Date: 12/26/2020   PT End of Session - 12/26/20 1204     Visit Number 7    Number of Visits 17    Date for PT Re-Evaluation 01/16/21    Authorization Type LSVT BIG, eval performed 12/12/2020    Authorization Time Period 12/12/2020-01/16/2021    Authorization - Visit Number 1    Authorization - Number of Visits 17    PT Start Time 1103    PT Stop Time 1159    PT Time Calculation (min) 56 min    Equipment Utilized During Treatment Gait belt    Activity Tolerance Patient tolerated treatment well;No increased pain    Behavior During Therapy [facility] for tasks assessed/performed             History reviewed. No pertinent past medical history.  History reviewed. No pertinent surgical history.  There were no vitals filed for this visit.   Subjective Assessment - 12/26/20 1103     Subjective Pt reports doing HEP reports it is easier. Has 3-4/10 R hip pain. Pt reports improvement with walking on grass at son's soccer game yesterday.    Pertinent History Pt is a pleasant 70 y/o male presenting to PT for LSVT BIG evaluation. Pt reports onset of Parkinson's sx occured over 3 years ago with noticing initial changes in gait and tremor of L hand. Pt reports gradual worsening of gait with shuffling, festination. Denies freezing episodes. Reports one fall over six months ago d/t gait festination. Pt also reports feelings of "stiffness" impacting his ability to perform transfers. He reports FOF with ambulating and ascending/descending steps. Pt does not use an AD, but does express that gait is his greatest concern, where he is unable to ambulate for long periods of time and has  difficulty with ambulating over uneven, compliant surfaces and up/down hills. Per chart other PMH is significant for: chronic lacunar infarct within L lentiform nucleus, sleep apnea (uses CPAP), Dupuytren Contracture LUE 5th digit, mild visual hallucinations, COVID (2020), uses hearing aids, blockage of coronary artery of heart, h/o cardiac cath (2008), HLD, HTN.    Limitations Walking;Writing;Standing;Sitting;House hold activities    How long can you sit comfortably? Pt reports >1 hr if chair is padded; pt reports if hard chair he is limited to 30 min d/t discomfort from numbness in LEs    How long can you stand comfortably? Pt reports if he is fatigued he is only able to stand 30-45 min.    How long can you walk comfortably? 50 yards    Diagnostic tests imging 09/05/2020 MR brain WO contrast: "IMPRESSION:  No evidence of acute intracranial abnormality.  Moderate generalized cerebral atrophy and cerebral white matter  chronic small vessel ischemic disease, progressed from the head CT  of 11/09/2009.  Small chronic lacunar infarct within the left lentiform nucleus.  Comparatively mild cerebellar atrophy.  Minimal right maxillary sinus mucosal thickening."    Patient Stated Goals Would like to walk without shuffling    Currently in Pain? Yes    Pain Location Hip    Pain Orientation Right            LSVT BIG Treatment:   Patient seen for LSVT Daily  Session Maximal Daily Exercises for facilitation/coordination of movement Sustained movements are designed to rescale the amplitude of movement output for generalization to daily functional activities.   Maximal Daily Exercises: Progressed all exercises to being performed with patient wearing 1.5# weights on B wrists Floor to Ceiling 10 with 10 second hold  Side to Side 10 Bilateral with 10 second hold  Forward Step and Reach 10 Bilateral Sideways Step and Reach 10 Bilateral  Backwards Step and Reach 10 Bilateral  Forwards Rock and Reach 10  Bilateral Sideways Rock and Reach 10 Bilateral   Maximal Daily Exercise Comments: Patient continues to exhibit greater independence with exercise technique, requiring less cueing and modeling from therapist.  He is most challenged with coordination and sequencing with backward step and reach and sideways rocking reach.  Patient requires fewer rest breaks.   Functional Component Tasks - performance and progress: 5 reps for each Sit to Stand x 5 from standard chair  Kneeling lunge to stand  3. Big step over object 4. Walk and Turn (around object);  5. Walk on compliant surface Comments: Patient performs majority of functional component tasks with good technique, requiring very little cueing this session.  He required modeling however with walk in turn for example to help overcome hypokinetic movement.   Hierarchy Tasks - performance and progress: Seated big reach 5x with each UE to each ipsilateral LE followed by big leg lift and cross onto contralteral knee (5x each); patient able to lift bilateral lower extremities into FABER position the session.   2. Walking up and down slope with 1.5# weights on wrists.  6 times.  PT still provides hands-on assist for left shoulder extension with arm swing, however patient reports greater awareness of range of motion being utilized during intervention.  He does exhibit festinating gait with left remaining rep.     Gait - with 1.5# weights on wrists, SBA-CGA provided throughout due to festination/mild postural instability at times as pt fatigued as this was performed at end of session..  PT continues to provide cuing for big steps, big arm swing, particularly left shoulder extension approx 60-80% of time to override hypokinesia/bradykinesia .  Pt ambulates in moderately busy environment from PT gym to sloped hallway on upper floor and back.  Firm surface and sloped surface.  Carryover Assignment: Big push a grocery cart.  Advised patient to perform away from  other people for safety.  Verbalized understanding   homework: Pt to perform all 7 LSVT BIG exercises, 5 Functional Component Tasks and big walking. Instructed to use support surface for functional component tasks (except for STS) on this date for safety. Updated carryover assignment.      Pt educated throughout session about proper posture and technique with exercises. Improved exercise technique, movement at target joints, use of target muscles after min to mod verbal, visual, tactile cues.    Note: Portions of this document were prepared using Dragon voice recognition software and although reviewed may contain unintentional dictation errors in syntax, grammar, or spelling.     PT Education - 12/26/20 1203     Education Details Continued education on exercise technique, body mechanics.    Person(s) Educated Patient    Methods Explanation;Tactile cues;Verbal cues;Handout;Demonstration    Comprehension Verbalized understanding;Returned demonstration;Verbal cues required;Need further instruction;Tactile cues required              PT Short Term Goals - 12/17/20 1131       PT SHORT TERM GOAL #1   Title  Pt will be independent with LSVT BIG HEP in order to improve functional mobility and balance to decrease fall risk and improve function at home.    Baseline 9/6: initiated    Time 3    Period Weeks    Status New    Target Date 01/02/21               PT Long Term Goals - 12/13/20 1202       PT LONG TERM GOAL #1   Title Patient will increase FOTO score to equal to or greater than 61 to demonstrate statistically significant improvement in mobility and quality of life.    Baseline 9/1: 52    Time 5    Period Weeks    Status New    Target Date 01/16/21      PT LONG TERM GOAL #2   Title Patient will tolerate 5 seconds of single leg stance without loss of balance to improve ability to step over obstacles/up and down curbs safely.    Baseline 9/1: 2-3 sec BLEs    Time 5     Period Weeks    Status New    Target Date 01/16/21      PT LONG TERM GOAL #3   Title Pt will improve DGI by at least 3 points in order to demonstrate clinically significant improvement in balance and decreased risk for falls so pt can safely go on walks outdoors with his wife.    Baseline 9/1: 18/24    Time 5    Period Weeks    Status New    Target Date 01/16/21      PT LONG TERM GOAL #4   Title Patient will increase 10 meter walk test to >1.16m/s as to improve gait speed for better community ambulation.    Baseline 9/1: 0.89 m/s    Time 5    Period Weeks    Status New    Target Date 01/16/21                   Plan - 12/26/20 1204     Clinical Impression Statement Patient exhibits improved independence with performing maximal daily exercises.  Patient continues to require the most cueing with backward step and reach and sideways rocking reach.  Requires some hands-on assist and demonstration for sequencing.  Patient also shows improved endurance and activity tolerance requiring fewer rest breaks.  Patient still has trouble correcting for left shoulder extension with arm swing.  The patient will benefit from continued LSVT big PT treatments to continue to improve hypokinetic and bradykinetic movement in order to improve balance, mobility, and gait mechanics.    Personal Factors and Comorbidities Age;Comorbidity 1;Comorbidity 2;Comorbidity 3+;Fitness;Time since onset of injury/illness/exacerbation    Comorbidities chronic lacunar infarct within L lentiform nucleus, hx of COVID19 (2020), HTN, duputyren contracture LUE 5th digit,    Examination-Activity Limitations Hygiene/Grooming;Bend;Stand;Sit;Locomotion Level;Transfers;Stairs;Dressing;Squat    Examination-Participation Restrictions Community Activity;Shop;Yard Work;Cleaning;Laundry    Stability/Clinical Decision Making Evolving/Moderate complexity    Rehab Potential Good    PT Frequency 4x / week    PT Duration Other  (comment)   coverage for 5 weeks to cover the full 4 week program from evaluation onward   PT Treatment/Interventions ADLs/Self Care Home Management;DME Instruction;Gait training;Stair training;Functional mobility training;Therapeutic activities;Balance training;Therapeutic exercise;Neuromuscular re-education;Patient/family education;Orthotic Fit/Training;Manual techniques;Passive range of motion;Energy conservation;Taping;Splinting    PT Next Visit Plan Initiate maximal daily exercises, functional component task training, hirarchy task trianing, BIG walking, HEP/daiy carryover assignments  PT Home Exercise Plan 7 LSVT BIG exercises, 5 functional component tasks, big walking and carryover assignment (see note 9/6 for carryover). Handout/packet provided with additional written instructions.  Updated carryover assignment on this date 12/26/2020 (see note)    Consulted and Agree with Plan of Care Patient             Patient will benefit from skilled therapeutic intervention in order to improve the following deficits and impairments:  Abnormal gait, Decreased activity tolerance, Decreased endurance, Decreased range of motion, Decreased knowledge of use of DME, Hypomobility, Improper body mechanics, Impaired UE functional use, Decreased balance, Decreased coordination, Decreased mobility, Difficulty walking, Impaired flexibility, Postural dysfunction  Visit Diagnosis: Other abnormalities of gait and mobility  Unsteadiness on feet  Other lack of coordination  Parkinson disease (HCC)     Problem List There are no problems to display for this patient.   Baird Kay, PT 12/26/2020, 12:12 PM  [facility] MAIN [facility] SERVICES [address] [place], [place], [address] Phone: [phone]   Fax:  [phone]  Name: Kyle Conner MRN: [id number] Date of Birth: 1951/01/29

## 2020-12-27 ENCOUNTER — Ambulatory Visit: Payer: Medicare Other

## 2020-12-27 DIAGNOSIS — G20 Parkinson's disease: Secondary | ICD-10-CM

## 2020-12-27 DIAGNOSIS — R278 Other lack of coordination: Secondary | ICD-10-CM

## 2020-12-27 DIAGNOSIS — R2681 Unsteadiness on feet: Secondary | ICD-10-CM

## 2020-12-27 DIAGNOSIS — R2689 Other abnormalities of gait and mobility: Secondary | ICD-10-CM

## 2020-12-27 NOTE — Therapy (Signed)
[place] [facility] MAIN [facility] SERVICES [address] [place], [place], [address] Phone: [phone]   Fax:  [phone]  Physical Therapy Treatment  Patient Details  Name: Kyle Conner MRN: [id number] Date of Birth: 1950-12-27 Referring Provider (PT): Lonell Face, MD   Encounter Date: 12/27/2020   PT End of Session - 12/27/20 1116     Visit Number 8    Number of Visits 17    Date for PT Re-Evaluation 01/16/21    Authorization Type LSVT BIG, eval performed 12/12/2020    Authorization Time Period 12/12/2020-01/16/2021    Authorization - Visit Number 1    Authorization - Number of Visits 17    PT Start Time 1009    PT Stop Time 1108    PT Time Calculation (min) 59 min    Equipment Utilized During Treatment Gait belt    Activity Tolerance Patient tolerated treatment well;Patient limited by fatigue    Behavior During Therapy [facility] for tasks assessed/performed             History reviewed. No pertinent past medical history.  History reviewed. No pertinent surgical history.  There were no vitals filed for this visit.   Subjective Assessment - 12/27/20 1009     Subjective Patient reports his daughter leaves from visiting today.  Patient reports he forgot about his HEP after his appointment yesterday.  Patient is noticing changes in his movement at home for the better and reports that his spouse is also noticing improvements.  He reports it has been easier to walk on soccer field at his son's games.    Pertinent History Pt is a pleasant 70 y/o male presenting to PT for LSVT BIG evaluation. Pt reports onset of Parkinson's sx occured over 3 years ago with noticing initial changes in gait and tremor of L hand. Pt reports gradual worsening of gait with shuffling, festination. Denies freezing episodes. Reports one fall over six months ago d/t gait festination. Pt also reports feelings of "stiffness" impacting his ability to perform transfers. He  reports FOF with ambulating and ascending/descending steps. Pt does not use an AD, but does express that gait is his greatest concern, where he is unable to ambulate for long periods of time and has difficulty with ambulating over uneven, compliant surfaces and up/down hills. Per chart other PMH is significant for: chronic lacunar infarct within L lentiform nucleus, sleep apnea (uses CPAP), Dupuytren Contracture LUE 5th digit, mild visual hallucinations, COVID (2020), uses hearing aids, blockage of coronary artery of heart, h/o cardiac cath (2008), HLD, HTN.    Limitations Walking;Writing;Standing;Sitting;House hold activities    How long can you sit comfortably? Pt reports >1 hr if chair is padded; pt reports if hard chair he is limited to 30 min d/t discomfort from numbness in LEs    How long can you stand comfortably? Pt reports if he is fatigued he is only able to stand 30-45 min.    How long can you walk comfortably? 50 yards    Diagnostic tests imging 09/05/2020 MR brain WO contrast: "IMPRESSION:  No evidence of acute intracranial abnormality.  Moderate generalized cerebral atrophy and cerebral white matter  chronic small vessel ischemic disease, progressed from the head CT  of 11/09/2009.  Small chronic lacunar infarct within the left lentiform nucleus.  Comparatively mild cerebellar atrophy.  Minimal right maxillary sinus mucosal thickening."    Patient Stated Goals Would like to walk without shuffling    Currently in Pain?  Yes    Pain Location Back    Pain Orientation Lower             LSVT BIG Treatment:   Patient seen for LSVT Daily Session Maximal Daily Exercises for facilitation/coordination of movement Sustained movements are designed to rescale the amplitude of movement output for generalization to daily functional activities.   Maximal Daily Exercises: Progressed all exercises to being performed with patient wearing 1.5# weights on B wrists Floor to Ceiling 10 with 10 second hold   Side to Side 10 Bilateral with 10 second hold  Forward Step and Reach 15 Bilateral Sideways Step and Reach 10 Bilateral  Backwards Step and Reach 10 Bilateral  Forwards Rock and Reach 10 Bilateral Sideways Rock and Reach 10 Bilateral   Maximal Daily Exercise Comments: Patient continues to exhibit increased independence with exercise technique, and is more consistent with sustaining large amplitude movement with all exercises. While he has improved coordination with backward step and reach, this remains the most difficult exercise for patient. It is with this exercise that patient requires the most cueing for left shoulder extension.   Functional Component Tasks - performance and progress: 5 reps for each;  Patient wearing 1.5 pound weights on wrists Sit to Stand x 5 from standard chair  Kneeling lunge to stand  3. Big step over object 4. Walk and Turn (around object);  5. Walk on compliant surface Comments: No postural stability with exercises. Patient completes 3 out of 5 reps of kneeling lunge to stand pwith increased amplitude. Patient most challenged with hypokinesia with walking and turning.   Hierarchy Tasks - performance and progress: wearing 1.5# weights on wrists- Seated big reach 5x with each UE to each ipsilateral LE followed by big leg lift and cross onto contralteral knee (5x each); Pt able to complete reps on BLEs and take shoes off and place them back on. Still rates challenging, particularly on LLE. Walking up and down slope with 1.5# weights on wrists.  6 times. Pt does require briefer rest breaks, but still fatigues and exhibits festinating gait with remaining 2 reps. Pt is more consistent in sustaining B armswing into extension. More cuing today for RLE step length as he improves LLE step length.       Gait -patient ambulates 3 x 148 feet in addition to ambulating from upstairs lobby to clinic gym. Pt wearing 1.5# weights on wrists throughout. PT continues to provide  SBA-CGA d/t festinating gait with fatigue, but no outright postural instability. Pt requires less CGA compared to prior sessions. Pt with improved shoulder extension for armswing. Requires more cuing for RLE step length today as pt more focused on LLE and is more consistent with improved LLE step length.   Carryover Assignment: Big high-five with son at his son's soccer game.  Pt verbalized understanding    homework: Pt to perform all 7 LSVT BIG exercises, 5 Functional Component Tasks and big walking. Instructed to use support surface for functional component tasks (except for STS) on this date for safety. Updated carryover assignment.      Pt educated throughout session about proper posture and technique with exercises. Improved exercise technique, movement at target joints, use of target muscles after min to mod verbal, visual, tactile cues.    Note: Portions of this document were prepared using Dragon voice recognition software and although reviewed may contain unintentional dictation errors in syntax, grammar, or spelling.       PT Education - 12/27/20 1116  Education Details Continued education on exercise technique, body mechanics.  PT also reinforces importance of HEP.    Person(s) Educated Patient    Methods Explanation;Verbal cues;Demonstration;Tactile cues;Handout    Comprehension Verbalized understanding;Returned demonstration;Tactile cues required;Verbal cues required;Need further instruction              PT Short Term Goals - 12/17/20 1131       PT SHORT TERM GOAL #1   Title Pt will be independent with LSVT BIG HEP in order to improve functional mobility and balance to decrease fall risk and improve function at home.    Baseline 9/6: initiated    Time 3    Period Weeks    Status New    Target Date 01/02/21               PT Long Term Goals - 12/13/20 1202       PT LONG TERM GOAL #1   Title Patient will increase FOTO score to equal to or greater than 61  to demonstrate statistically significant improvement in mobility and quality of life.    Baseline 9/1: 52    Time 5    Period Weeks    Status New    Target Date 01/16/21      PT LONG TERM GOAL #2   Title Patient will tolerate 5 seconds of single leg stance without loss of balance to improve ability to step over obstacles/up and down curbs safely.    Baseline 9/1: 2-3 sec BLEs    Time 5    Period Weeks    Status New    Target Date 01/16/21      PT LONG TERM GOAL #3   Title Pt will improve DGI by at least 3 points in order to demonstrate clinically significant improvement in balance and decreased risk for falls so pt can safely go on walks outdoors with his wife.    Baseline 9/1: 18/24    Time 5    Period Weeks    Status New    Target Date 01/16/21      PT LONG TERM GOAL #4   Title Patient will increase 10 meter walk test to >1.55m/s as to improve gait speed for better community ambulation.    Baseline 9/1: 0.89 m/s    Time 5    Period Weeks    Status New    Target Date 01/16/21                   Plan - 12/27/20 1117     Clinical Impression Statement Patient making gains in PT reporting his spouse is noticing improvements in his movement outside of PT as is he.  Patient exhibits progress being able to bring his bilateral lower extremities into the FABER position with increased amplitude and take his shoes off and placed them back on. Patient consistently exhibits big posture with majority of maximal daily exercises.  He continues to be more consistent with improving amplitude of left arm swing particularly left shoulder extension with arm swing.  The patient still requires rest breaks due to fatigue with big walking exercise, although he is able to return to activity sooner than before.  Turning continues to be most challenging as patient struggles with hypokinetic movement. The patient will continue to benefit from LSVT BIG physical therapy treatments to continue to improve  mobility, strength, balance, and gait to increase ease and safety with all ADLs and quality of life.    Personal Factors  and Comorbidities Age;Comorbidity 1;Comorbidity 2;Comorbidity 3+;Fitness;Time since onset of injury/illness/exacerbation    Comorbidities chronic lacunar infarct within L lentiform nucleus, hx of COVID19 (2020), HTN, duputyren contracture LUE 5th digit,    Examination-Activity Limitations Hygiene/Grooming;Bend;Stand;Sit;Locomotion Level;Transfers;Stairs;Dressing;Squat    Examination-Participation Restrictions Community Activity;Shop;Yard Work;Cleaning;Laundry    Stability/Clinical Decision Making Evolving/Moderate complexity    Rehab Potential Good    PT Frequency 4x / week    PT Duration Other (comment)   coverage for 5 weeks to cover the full 4 week program from evaluation onward   PT Treatment/Interventions ADLs/Self Care Home Management;DME Instruction;Gait training;Stair training;Functional mobility training;Therapeutic activities;Balance training;Therapeutic exercise;Neuromuscular re-education;Patient/family education;Orthotic Fit/Training;Manual techniques;Passive range of motion;Energy conservation;Taping;Splinting    PT Next Visit Plan Initiate maximal daily exercises, functional component task training, hirarchy task trianing, BIG walking, HEP/daiy carryover assignments    PT Home Exercise Plan 7 LSVT BIG exercises, 5 functional component tasks, big walking and carryover assignment (see note 9/6 for carryover). Handout/packet provided with additional written instructions.  Updated carryover assignment on this date 12/27/2020 (see note)    Consulted and Agree with Plan of Care Patient             Patient will benefit from skilled therapeutic intervention in order to improve the following deficits and impairments:  Abnormal gait, Decreased activity tolerance, Decreased endurance, Decreased range of motion, Decreased knowledge of use of DME, Hypomobility, Improper body  mechanics, Impaired UE functional use, Decreased balance, Decreased coordination, Decreased mobility, Difficulty walking, Impaired flexibility, Postural dysfunction  Visit Diagnosis: Other abnormalities of gait and mobility  Other lack of coordination  Unsteadiness on feet  Parkinson disease (HCC)     Problem List There are no problems to display for this patient.   Baird Kay, PT 12/27/2020, 11:30 AM  [place] [facility] MAIN [facility] SERVICES [address] [place], [place], [address] Phone: [phone]   Fax:  [phone]  Name: Kyle Conner MRN: [id number] Date of Birth: 11-08-1950

## 2020-12-31 ENCOUNTER — Ambulatory Visit: Payer: Medicare Other

## 2020-12-31 ENCOUNTER — Other Ambulatory Visit: Payer: Self-pay

## 2020-12-31 DIAGNOSIS — R278 Other lack of coordination: Secondary | ICD-10-CM

## 2020-12-31 DIAGNOSIS — G20 Parkinson's disease: Secondary | ICD-10-CM

## 2020-12-31 DIAGNOSIS — R2689 Other abnormalities of gait and mobility: Secondary | ICD-10-CM

## 2020-12-31 DIAGNOSIS — R2681 Unsteadiness on feet: Secondary | ICD-10-CM

## 2020-12-31 NOTE — Therapy (Signed)
[place] [facility] MAIN [facility] SERVICES [address] [place], [place], [address] Phone: [phone]   Fax:  [phone]  Physical Therapy Treatment  Patient Details  Name: Kyle Conner MRN: [id number] Date of Birth: Aug 31, 1950 Referring Provider (PT): Lonell Face, MD   Encounter Date: 12/31/2020   PT End of Session - 12/31/20 1120     Visit Number 9    Number of Visits 17    Date for PT Re-Evaluation 01/16/21    Authorization Type LSVT BIG, eval performed 12/12/2020    Authorization Time Period 12/12/2020-01/16/2021    Authorization - Visit Number 1    Authorization - Number of Visits 17    PT Start Time 1016    PT Stop Time 1113    PT Time Calculation (min) 57 min    Equipment Utilized During Treatment Gait belt    Activity Tolerance Patient tolerated treatment well;No increased pain    Behavior During Therapy [facility] for tasks assessed/performed             History reviewed. No pertinent past medical history.  History reviewed. No pertinent surgical history.  There were no vitals filed for this visit.   Subjective Assessment - 12/31/20 1014     Subjective Pt reports no issues with walking on soccer field at his son's game. He says it was a long walk with a lot of tree roots and that he did not have difficulties. Pt reports doing his HEP. Pt reports some R hip pain currently, but says it improves with movement. Feels like a muscle.    Pertinent History Pt is a pleasant 70 y/o male presenting to PT for LSVT BIG evaluation. Pt reports onset of Parkinson's sx occured over 3 years ago with noticing initial changes in gait and tremor of L hand. Pt reports gradual worsening of gait with shuffling, festination. Denies freezing episodes. Reports one fall over six months ago d/t gait festination. Pt also reports feelings of "stiffness" impacting his ability to perform transfers. He reports FOF with ambulating and ascending/descending steps. Pt  does not use an AD, but does express that gait is his greatest concern, where he is unable to ambulate for long periods of time and has difficulty with ambulating over uneven, compliant surfaces and up/down hills. Per chart other PMH is significant for: chronic lacunar infarct within L lentiform nucleus, sleep apnea (uses CPAP), Dupuytren Contracture LUE 5th digit, mild visual hallucinations, COVID (2020), uses hearing aids, blockage of coronary artery of heart, h/o cardiac cath (2008), HLD, HTN.    Limitations Walking;Writing;Standing;Sitting;House hold activities    How long can you sit comfortably? Pt reports >1 hr if chair is padded; pt reports if hard chair he is limited to 30 min d/t discomfort from numbness in LEs    How long can you stand comfortably? Pt reports if he is fatigued he is only able to stand 30-45 min.    How long can you walk comfortably? 50 yards    Diagnostic tests imging 09/05/2020 MR brain WO contrast: "IMPRESSION:  No evidence of acute intracranial abnormality.  Moderate generalized cerebral atrophy and cerebral white matter  chronic small vessel ischemic disease, progressed from the head CT  of 11/09/2009.  Small chronic lacunar infarct within the left lentiform nucleus.  Comparatively mild cerebellar atrophy.  Minimal right maxillary sinus mucosal thickening."    Patient Stated Goals Would like to walk without shuffling    Currently in Pain? Yes  Pain Score 3     Pain Location Hip    Pain Orientation Right            LSVT BIG Treatment:   Patient seen for LSVT Daily Session Maximal Daily Exercises for facilitation/coordination of movement Sustained movements are designed to rescale the amplitude of movement output for generalization to daily functional activities.   Maximal Daily Exercises: Progressed all exercises to being performed with patient wearing 1.5# weights on B wrists Floor to Ceiling 10 with 10 second hold   Side to Side 10 Bilateral with 10 second  hold; improved upright posture  Forward Step and Reach 15 Bilateral Sideways Step and Reach 15 Bilateral  Backwards Step and Reach 10 Bilateral; improved coordination this session, able to perform more reps without cuing. Forwards Rock and Reach 10 Bilateral; able to demo second side mostly independently and without need for cues, does exhibit some mild postural instability but maintains balance with step-strategy. Sideways Rock and Reach 10 Bilateral   Maximal Daily Exercise Comments: Pt able to sustain large amplitude movement with majority of maximal daily exercises with <50% need for cuing to increase amplitude. He also shows improved coordination with backward step and reach compared to prior sessions. Pt able to progress in overall volume of reps performed.    Functional Component Tasks - performance and progress: 5 reps for each;  Patient wearing 1.5 pound weights on wrists Sit to Stand x 5 from standard chair  Kneeling lunge to stand  3. Big step over object 4. Walk and Turn (around object); minor postural instability with last rep, consistently sustains big turn for all reps 5. Walk on compliant surface - no postural instability Comments: Mild postural instability with walk and turn, however, pt able to sustain big amplitude movement with turning with minimal cuing.  Pt sustains large amplitude movement with all other functional component tasks with <20% cuing.    Hierarchy Tasks - performance and progress: wearing 1.5# weights on wrists- Seated big reach 5x with each UE to each ipsilateral LE followed by big leg lift and cross onto contralteral knee (5x each); Pt able to complete reps on BLEs and take shoes off and place them back on. Pt continues to be challenged with LLE. Had pt maintain FABER position for 3 seconds with each rep.  Walking up and down slope with 1.5# weights on wrists.  6 times. Pt still fatigues with last couple reps, but does not exhibit festinating gait pt able to  consistently sustain larger arm-swing B, however, still requires cuing for coordinating larger arm-swing with larger steps.    Gait -patient ambulates 1x 148 feet in addition to ambulating BIG from clinic to upstairs lobby and from upstairs lobby to clinic gym. Pt wearing 1.5# weights on wrists throughout. PT  provides SBA. Pt with reduced festination of gait this session, likely due to improved activity tolerance and pt reporting he is more aware of using large-amplitude movements.   Carryover Assignment: Big walking (focus on arm-swing) while at soccer game tonight.    homework: Pt to perform all 7 LSVT BIG exercises, 5 Functional Component Tasks and big walking. Updated carryover assignment.   Reports improvement with right hip symptoms following maximal daily exercises.     Pt educated throughout session about proper posture and technique with exercises. Improved exercise technique, movement at target joints, use of target muscles after min to mod verbal, visual, tactile cues.    Note: Portions of this document were prepared using Dragon  voice recognition software and although reviewed may contain unintentional dictation errors in syntax, grammar, or spelling.      PT Education - 12/31/20 1120     Education Details Exercise technique, body mechanics, focus on technique with backwards step and reach. Updated carryover task.    Person(s) Educated Patient    Methods Explanation;Demonstration;Tactile cues;Verbal cues;Handout    Comprehension Verbalized understanding;Returned demonstration;Tactile cues required;Verbal cues required;Need further instruction              PT Short Term Goals - 12/17/20 1131       PT SHORT TERM GOAL #1   Title Pt will be independent with LSVT BIG HEP in order to improve functional mobility and balance to decrease fall risk and improve function at home.    Baseline 9/6: initiated    Time 3    Period Weeks    Status New    Target Date 01/02/21                PT Long Term Goals - 12/13/20 1202       PT LONG TERM GOAL #1   Title Patient will increase FOTO score to equal to or greater than 61 to demonstrate statistically significant improvement in mobility and quality of life.    Baseline 9/1: 52    Time 5    Period Weeks    Status New    Target Date 01/16/21      PT LONG TERM GOAL #2   Title Patient will tolerate 5 seconds of single leg stance without loss of balance to improve ability to step over obstacles/up and down curbs safely.    Baseline 9/1: 2-3 sec BLEs    Time 5    Period Weeks    Status New    Target Date 01/16/21      PT LONG TERM GOAL #3   Title Pt will improve DGI by at least 3 points in order to demonstrate clinically significant improvement in balance and decreased risk for falls so pt can safely go on walks outdoors with his wife.    Baseline 9/1: 18/24    Time 5    Period Weeks    Status New    Target Date 01/16/21      PT LONG TERM GOAL #4   Title Patient will increase 10 meter walk test to >1.45m/s as to improve gait speed for better community ambulation.    Baseline 9/1: 0.89 m/s    Time 5    Period Weeks    Status New    Target Date 01/16/21                   Plan - 12/31/20 1130     Clinical Impression Statement Patient with improved activity tolerance and endurance as evidenced by performing increased reps of maximal daily exercises.  Patient requires less than 50% cueing to sustain increased amplitude movement with maximal daily exercises, an improvement from previous sessions.  Patient also shows improvement with large amplitude walk and turn requiring less than 20% cueing for technique.  Patient with fewer instances of festinating gait this session.  Patient most challenged with coordinating big arm swing with big steps and sustaining both simultaneously.  The patient will benefit from further LSVT BIG physical therapy treatments to continue to improve mobility, strength,  gait, and balance to increase quality of life and ease and safety with ADLs.    Personal Factors and Comorbidities Age;Comorbidity 1;Comorbidity 2;Comorbidity 3+;Fitness;Time since  onset of injury/illness/exacerbation    Comorbidities chronic lacunar infarct within L lentiform nucleus, hx of COVID19 (2020), HTN, duputyren contracture LUE 5th digit,    Examination-Activity Limitations Hygiene/Grooming;Bend;Stand;Sit;Locomotion Level;Transfers;Stairs;Dressing;Squat    Examination-Participation Restrictions Community Activity;Shop;Yard Work;Cleaning;Laundry    Stability/Clinical Decision Making Evolving/Moderate complexity    Rehab Potential Good    PT Frequency 4x / week    PT Duration Other (comment)   coverage for 5 weeks to cover the full 4 week program from evaluation onward   PT Treatment/Interventions ADLs/Self Care Home Management;DME Instruction;Gait training;Stair training;Functional mobility training;Therapeutic activities;Balance training;Therapeutic exercise;Neuromuscular re-education;Patient/family education;Orthotic Fit/Training;Manual techniques;Passive range of motion;Energy conservation;Taping;Splinting    PT Next Visit Plan Initiate maximal daily exercises, functional component task training, hirarchy task trianing, BIG walking, HEP/daiy carryover assignments    PT Home Exercise Plan 7 LSVT BIG exercises, 5 functional component tasks, big walking and carryover assignment. Handout/packet provided with additional written instructions.  Updated carryover assignment on this date 12/31/2020 (see note)    Consulted and Agree with Plan of Care Patient             Patient will benefit from skilled therapeutic intervention in order to improve the following deficits and impairments:  Abnormal gait, Decreased activity tolerance, Decreased endurance, Decreased range of motion, Decreased knowledge of use of DME, Hypomobility, Improper body mechanics, Impaired UE functional use, Decreased  balance, Decreased coordination, Decreased mobility, Difficulty walking, Impaired flexibility, Postural dysfunction  Visit Diagnosis: Other abnormalities of gait and mobility  Other lack of coordination  Unsteadiness on feet  Parkinson disease (HCC)     Problem List There are no problems to display for this patient.   Baird Kay, PT 12/31/2020, 11:35 AM   [place] [facility] MAIN [facility] SERVICES [address] [place], [place], [address] Phone: [phone]   Fax:  [phone]  Name: Kyle Conner MRN: [id number] Date of Birth: Apr 29, 1950

## 2021-01-01 ENCOUNTER — Ambulatory Visit: Payer: Medicare Other

## 2021-01-01 DIAGNOSIS — R2681 Unsteadiness on feet: Secondary | ICD-10-CM

## 2021-01-01 DIAGNOSIS — G20 Parkinson's disease: Secondary | ICD-10-CM | POA: Diagnosis not present

## 2021-01-01 DIAGNOSIS — R278 Other lack of coordination: Secondary | ICD-10-CM

## 2021-01-01 DIAGNOSIS — R2689 Other abnormalities of gait and mobility: Secondary | ICD-10-CM

## 2021-01-01 NOTE — Therapy (Signed)
[place] MAIN [facility] SERVICES [address] [place], [place], [address] Phone: [phone]   Fax:  [phone]  Physical Therapy Treatment/Physical Therapy Progress Note   Dates of reporting period  12/12/2020   to   01/01/2021   Patient Details  Name: Kyle Conner MRN: [id number] Date of Birth: August 01, 1950 Referring Provider (PT): Vladimir Crofts, MD   Encounter Date: 01/01/2021   PT End of Session - 01/01/21 1940     Visit Number 10    Number of Visits 17    Date for PT Re-Evaluation 01/16/21    Authorization Type LSVT BIG, eval performed 12/12/2020    Authorization Time Period 12/12/2020-01/16/2021    Authorization - Visit Number 1    Authorization - Number of Visits 17    PT Start Time 1017    PT Stop Time 1115    PT Time Calculation (min) 58 min    Equipment Utilized During Treatment Gait belt    Activity Tolerance Patient tolerated treatment well;No increased pain;Patient limited by fatigue    Behavior During Therapy [facility] for tasks assessed/performed             History reviewed. No pertinent past medical history.  History reviewed. No pertinent surgical history.  There were no vitals filed for this visit.    LSVT BIG Treatment:   Patient seen for LSVT Daily Session Maximal Daily Exercises for facilitation/coordination of movement Sustained movements are designed to rescale the amplitude of movement output for generalization to daily functional activities.   Maximal Daily Exercises: Patient wearing 1.5# weights on B wrists Floor to Ceiling 10 with 10 second hold   Side to Side 10 Bilateral with 10 second hold Forward Step and Reach 15 Bilateral Sideways Step and Reach 15 Bilateral  Backwards Step and Reach 15 Bilateral Forwards Rock and Reach 15 Bilateral; Sideways Rock and Reach 15 Bilateral   Maximal Daily Exercise Comments: Pt has now been able to progress volume of reps with all maximal daily exercises. Pt  requires cuing 35-40% of the time for sequencing/technique with majority of exercises. Pt is able to sustain large amplitude movement throughout. The pt is still most challenged with backwards step and reach, requiring cuing with approx 50% of reps.    Functional Component Tasks - performance and progress: 5 reps for each;  Patient wearing 1.5 pound weights on wrists Sit to Stand x 5 from standard chair  Kneeling lunge to stand  3. Big step over object 4. Walk and Turn (around object); 5. Walk on compliant surface  Comments: Pt requires minimal cuing with majority of functional component tasks successfully sustaining large amplitude movements. However, pt did have some increased difficulty with walking and turning today, reporting fatigue and with some hypokinetic movement.    Hierarchy Tasks - performance and progress: wearing 1.5# weights on wrists- Big reach to LE with big lift of LE into seated FABER position followed by taking shoe and sock off and putting both back on; Pt able to complete reps on BLEs and take shoes off and place them back on. Continues to be most challenged with initial BIG movement with LLE into faber position. No difficulty with removing shoes or socks and donning them. Walking over various surfaces outside (curbs, sidewalk, grass, slopes). Performed last and incorporated into gait training from PT clinic. Pt reports increased fatigue by this point in session and requires several rest breaks, must grab onto objects d/t postural instability, festinating gait. PT  provides up to min-assist to help pt regain balance. Pt reports he thinks the heat outside is impacting him as well as increased activity levels from yesterday.       Gait - Pt ambulates from PT clinic gym through cafeteria and to outdoor track (see above for hierarchy task) and then back with focus on upright posture, big arm-swing and big steps B. Performed at end of session with pt reporting fatigue. Requires up to  min assist from PT d/t postural instability and multiple rest breaks d/t festinating gait.   Carryover Assignment: bring each LE into FABER position with big effort and use big effort to take off shoes once home   homework: Pt to perform all 7 LSVT BIG exercises, 5 Functional Component Tasks and big walking. Updated carryover assignment.    Reports improvement with right hip symptoms following maximal daily exercises.   Pt educated throughout session about proper posture and technique with exercises. Improved exercise technique, movement at target joints, use of target muscles after min to mod verbal, visual, tactile cues.   Patient's condition has the potential to improve in response to therapy. Maximum improvement is yet to be obtained. The anticipated improvement is attainable and reasonable in a generally predictable time.     PT Education - 01/01/21 1939     Education Details exercise technique body mechanics, focus on upright posture/gait mechanics, updated carryover assignment    Person(s) Educated Patient    Methods Explanation;Demonstration;Tactile cues;Verbal cues;Handout    Comprehension Verbalized understanding;Returned demonstration;Verbal cues required;Need further instruction              PT Short Term Goals - 01/01/21 1941       PT SHORT TERM GOAL #1   Title Pt will be independent with LSVT BIG HEP in order to improve functional mobility and balance to decrease fall risk and improve function at home.    Baseline 9/6: initiated 9/21: pt still requires cuing for correct technique with some of his HEP    Time 3    Period Weeks    Status Partially Met    Target Date 01/02/21               PT Long Term Goals - 01/01/21 1944       PT LONG TERM GOAL #1   Title Patient will increase FOTO score to equal to or greater than 61 to demonstrate statistically significant improvement in mobility and quality of life.    Baseline 9/1: 52 9/21: 62    Time 5    Period  Weeks    Status Achieved      PT LONG TERM GOAL #2   Title Patient will tolerate 5 seconds of single leg stance without loss of balance to improve ability to step over obstacles/up and down curbs safely.    Baseline 9/1: 2-3 sec BLEs; 9/21: pt achieves approx 4 seconds B    Time 5    Period Weeks    Status Partially Met    Target Date 01/16/21      PT LONG TERM GOAL #3   Title Pt will improve DGI by at least 3 points in order to demonstrate clinically significant improvement in balance and decreased risk for falls so pt can safely go on walks outdoors with his wife.    Baseline 9/1: 18/24; 9/21: deferred d/t time    Time 5    Period Weeks    Status On-going    Target Date 01/16/21  PT LONG TERM GOAL #4   Title Patient will increase 10 meter walk test to >1.54m/s as to improve gait speed for better community ambulation.    Baseline 9/1: 0.89 m/s; 9/21: 1.25 m/s    Time 5    Period Weeks    Status Achieved    Target Date 01/16/21                   Plan - 01/01/21 2000     Clinical Impression Statement Goals reassessed for progress note. Pt has achieved FOTO and 10MWT goal, indicating improved functional mobility, QOL, and gait speed. Pt also making gains with SLB, partially meeting goal. DGI had to be deferred d/t time in order to address maximal daily exercises, heirarchy and functional component tasks as well as BIG walking. While pt is becoming more independent with exercises for HEP, he is not yet fully independent.  Overall, pt is able to sustain large amplitude movement with majority of exercises indicating improved mobility. He was more challenged with gait training compared to previous session, in part due to performing this last, and pt reporting still feeling tired from increased activity from yesterday and due to the heat. The pt will continue to benefit from further LSVT BIG PT treatments to improve mobility, gait, balance, and strength to increase ease and safety  with ADLs.    Personal Factors and Comorbidities Age;Comorbidity 1;Comorbidity 2;Comorbidity 3+;Fitness;Time since onset of injury/illness/exacerbation    Comorbidities chronic lacunar infarct within L lentiform nucleus, hx of COVID19 (2020), HTN, duputyren contracture LUE 5th digit,    Examination-Activity Limitations Hygiene/Grooming;Bend;Stand;Sit;Locomotion Level;Transfers;Stairs;Dressing;Squat    Examination-Participation Restrictions Community Activity;Shop;Yard Work;Cleaning;Laundry    Stability/Clinical Decision Making Evolving/Moderate complexity    Rehab Potential Good    PT Frequency 4x / week    PT Duration Other (comment)   coverage for 5 weeks to cover the full 4 week program from evaluation onward   PT Treatment/Interventions ADLs/Self Care Home Management;DME Instruction;Gait training;Stair training;Functional mobility training;Therapeutic activities;Balance training;Therapeutic exercise;Neuromuscular re-education;Patient/family education;Orthotic Fit/Training;Manual techniques;Passive range of motion;Energy conservation;Taping;Splinting    PT Next Visit Plan maximal daily exercises, functional component task training, hierarchy task trianing, BIG walking, HEP/daily carryover assignments    PT Home Exercise Plan 7 LSVT BIG maximal daiy exercises, 5 functional component tasks, big walking and carryover assignment. Handout/packet provided with additional written instructions.  Updated carryover assignment on this date 01/01/2021 (see note)    Consulted and Agree with Plan of Care Patient             Patient will benefit from skilled therapeutic intervention in order to improve the following deficits and impairments:  Abnormal gait, Decreased activity tolerance, Decreased endurance, Decreased range of motion, Decreased knowledge of use of DME, Hypomobility, Improper body mechanics, Impaired UE functional use, Decreased balance, Decreased coordination, Decreased mobility, Difficulty  walking, Impaired flexibility, Postural dysfunction  Visit Diagnosis: Other abnormalities of gait and mobility  Unsteadiness on feet  Other lack of coordination  Parkinson disease ([place])     Problem List There are no problems to display for this patient.   Zollie Pee, PT 01/01/2021, 8:08 PM  [place] MAIN [facility] SERVICES [address] [place], [place], [address] Phone: [phone]   Fax:  [phone]  Name: Kyle Conner MRN: [id number] Date of Birth: 09-Nov-1950

## 2021-01-02 ENCOUNTER — Other Ambulatory Visit: Payer: Self-pay

## 2021-01-02 ENCOUNTER — Ambulatory Visit: Payer: Medicare Other

## 2021-01-02 DIAGNOSIS — G20 Parkinson's disease: Secondary | ICD-10-CM

## 2021-01-02 DIAGNOSIS — R2689 Other abnormalities of gait and mobility: Secondary | ICD-10-CM

## 2021-01-02 DIAGNOSIS — R2681 Unsteadiness on feet: Secondary | ICD-10-CM

## 2021-01-02 DIAGNOSIS — R278 Other lack of coordination: Secondary | ICD-10-CM

## 2021-01-02 NOTE — Therapy (Signed)
[place] MAIN [facility] SERVICES [address] [place], [place], [address] Phone: [phone]   Fax:  [phone]  Physical Therapy Treatment  Patient Details  Name: Kyle Conner MRN: [id number] Date of Birth: [birth year] Referring Provider (PT): Vladimir Crofts, MD   Encounter Date: 01/02/2021   PT End of Session - 01/02/21 2024     Visit Number 11    Number of Visits 17    Date for PT Re-Evaluation 01/16/21    Authorization Type LSVT BIG, eval performed 12/12/2020    Authorization Time Period 12/12/2020-01/16/2021    Authorization - Visit Number 1    Authorization - Number of Visits 17    PT Start Time 1108    PT Stop Time 1202    PT Time Calculation (min) 54 min    Equipment Utilized During Treatment Gait belt    Activity Tolerance Patient tolerated treatment well;No increased pain;Patient limited by fatigue    Behavior During Therapy [facility] for tasks assessed/performed             History reviewed. No pertinent past medical history.  History reviewed. No pertinent surgical history.  There were no vitals filed for this visit.   Subjective Assessment - 01/02/21 2023     Subjective Pt reports performing HEP. He reports no pain currently.    Pertinent History Pt is a pleasant 70 y/o male presenting to PT for LSVT BIG evaluation. Pt reports onset of Parkinson's sx occured over 3 years ago with noticing initial changes in gait and tremor of L hand. Pt reports gradual worsening of gait with shuffling, festination. Denies freezing episodes. Reports one fall over six months ago d/t gait festination. Pt also reports feelings of "stiffness" impacting his ability to perform transfers. He reports FOF with ambulating and ascending/descending steps. Pt does not use an AD, but does express that gait is his greatest concern, where he is unable to ambulate for long periods of time and has difficulty with ambulating over uneven, compliant surfaces and  up/down hills. Per chart other PMH is significant for: chronic lacunar infarct within L lentiform nucleus, sleep apnea (uses CPAP), Dupuytren Contracture LUE 5th digit, mild visual hallucinations, COVID (2020), uses hearing aids, blockage of coronary artery of heart, h/o cardiac cath (2008), HLD, HTN.    Limitations Walking;Writing;Standing;Sitting;House hold activities    How long can you sit comfortably? Pt reports >1 hr if chair is padded; pt reports if hard chair he is limited to 30 min d/t discomfort from numbness in LEs    How long can you stand comfortably? Pt reports if he is fatigued he is only able to stand 30-45 min.    How long can you walk comfortably? 50 yards    Diagnostic tests imging 09/05/2020 MR brain WO contrast: "IMPRESSION:  No evidence of acute intracranial abnormality.  Moderate generalized cerebral atrophy and cerebral white matter  chronic small vessel ischemic disease, progressed from the head CT  of 11/09/2009.  Small chronic lacunar infarct within the left lentiform nucleus.  Comparatively mild cerebellar atrophy.  Minimal right maxillary sinus mucosal thickening."    Patient Stated Goals Would like to walk without shuffling    Currently in Pain? No/denies             LSVT BIG Treatment:   Patient seen for LSVT Daily Session Maximal Daily Exercises for facilitation/coordination of movement Sustained movements are designed to rescale the amplitude of movement output for generalization to daily functional  activities.   Maximal Daily Exercises: Patient wearing 1.5# weights on B wrists Floor to Ceiling 10 with 10 second hold   Side to Side 10 Bilateral with 10 second hold Forward Step and Reach 15 Bilateral Sideways Step and Reach 15 Bilateral  Backwards Step and Reach 15 Bilateral Forwards Rock and Reach 15 Bilateral; Sideways Rock and Reach 15 Bilateral   Maximal Daily Exercise Comments: Pt requires cuing approximately 35% of the time for sequencing/technique  with majority of exercises. Pt sustains large amplitude movement with UE shoulder abduction >90% of the time, shoulder ext. 70% of the time and finger abduction approx. 50% of the time. Pt exhibits improved sequencing with backwards step and reach and sideways rock and reach. However, continues to be somewhat challenged with rotation necessary for sideways rock and reach.       Functional Component Tasks - performance and progress: 5 reps for each;  Patient wearing 1.5 pound weights on wrists Sit to Stand x 5 from standard chair  Kneeling lunge to stand  3. Big step over object 4. Walk and Turn (around object); 5. Walk on compliant surface  Comments: Pt independent with technique today with all functional component tasks, except for walking with a big turn around an object. The pt did show improvement from prior session (and was less fatigued), sustaining larger movement with 3/5 reps.    Hierarchy Tasks - performance and progress: wearing 1.5# weights on wrists- Big reach to LE with big lift of LE into seated FABER position followed by taking shoe and sock off and putting both back on (each LE); Pt independent with technique with RLE, continues to require cuing to override hypokinetic movement with LLE.  Walking over various surfaces outside (curbs, sidewalk, grass, slopes). Pt with improved activity tolerance compared to previous session. However, the pt still requires 3 seated rest breaks, and postural instability was elicited more due to fatigue than with changes in surface or obstacles. Pt with some shuffling that he was able to correct with cuing while ambulating over grass. PT provided CGA throughout.   Gait - Pt ambulates from PT clinic gym through cafeteria and to outdoor track (see above for hierarchy task) and then back with focus on upright posture, big arm-swing and big steps B. This was followed by [phone] ft laps around clinic. Moderate distractions in environment due to business. Pt  with improved endurance/activity tolerance compared to prior session. PT still cuing pt >50% of time for gait mechanics with focus on L arm-swing and step-length. See hierarchy task for more detail. PT provided CGA throughout.   Carryover Assignment: Big reach when putting away one of pt's dishes (did discuss only if patient can do so safely). Pt verbalized understanding.    homework: Pt to perform all 7 LSVT BIG exercises, 5 Functional Component Tasks and big walking. Updated carryover assignment.      Pt educated throughout session about proper posture and technique with exercises. Improved exercise technique, movement at target joints, use of target muscles after min to mod verbal, visual, tactile cues.    PT Education - 01/02/21 2023     Education Details exercise technique, body mechanics, carryover assignment    Person(s) Educated Patient    Methods Explanation;Demonstration;Tactile cues;Verbal cues;Handout    Comprehension Verbalized understanding;Returned demonstration;Verbal cues required;Need further instruction              PT Short Term Goals - 01/01/21 1941       PT SHORT TERM GOAL #1  Title Pt will be independent with LSVT BIG HEP in order to improve functional mobility and balance to decrease fall risk and improve function at home.    Baseline 9/6: initiated 9/21: pt still requires cuing for correct technique with some of his HEP    Time 3    Period Weeks    Status Partially Met    Target Date 01/02/21               PT Long Term Goals - 01/01/21 1944       PT LONG TERM GOAL #1   Title Patient will increase FOTO score to equal to or greater than 61 to demonstrate statistically significant improvement in mobility and quality of life.    Baseline 9/1: 52 9/21: 62    Time 5    Period Weeks    Status Achieved      PT LONG TERM GOAL #2   Title Patient will tolerate 5 seconds of single leg stance without loss of balance to improve ability to step over  obstacles/up and down curbs safely.    Baseline 9/1: 2-3 sec BLEs; 9/21: pt achieves approx 4 seconds B    Time 5    Period Weeks    Status Partially Met    Target Date 01/16/21      PT LONG TERM GOAL #3   Title Pt will improve DGI by at least 3 points in order to demonstrate clinically significant improvement in balance and decreased risk for falls so pt can safely go on walks outdoors with his wife.    Baseline 9/1: 18/24; 9/21: deferred d/t time    Time 5    Period Weeks    Status On-going    Target Date 01/16/21      PT LONG TERM GOAL #4   Title Patient will increase 10 meter walk test to >1.66m/s as to improve gait speed for better community ambulation.    Baseline 9/1: 0.89 m/s; 9/21: 1.25 m/s    Time 5    Period Weeks    Status Achieved    Target Date 01/16/21                   Plan - 01/02/21 2039     Clinical Impression Statement Pt with improved activity tolerance compared to previous session, requiring fewer rest breaks throughout. PT also only needed to provide CGA for gait training and hierarchy task of ambulating outdoors over various surfaces. While pt does show progress, he still required 3 rest breaks with these activities. Pt is overall independent with functional component tasks, with the exception of walking and turning big where he is still challenged with overcoming bradykinetic movement. The pt will benefit from further skilled LSVT BIG treatments in order to improve mobility, gait, balance, and strength to increase ease and safety with ADLs.    Personal Factors and Comorbidities Age;Comorbidity 1;Comorbidity 2;Comorbidity 3+;Fitness;Time since onset of injury/illness/exacerbation    Comorbidities chronic lacunar infarct within L lentiform nucleus, hx of COVID19 (2020), HTN, duputyren contracture LUE 5th digit,    Examination-Activity Limitations Hygiene/Grooming;Bend;Stand;Sit;Locomotion Level;Transfers;Stairs;Dressing;Squat    Examination-Participation  Restrictions Community Activity;Shop;Yard Work;Cleaning;Laundry    Stability/Clinical Decision Making Evolving/Moderate complexity    Rehab Potential Good    PT Frequency 4x / week    PT Duration Other (comment)   coverage for 5 weeks to cover the full 4 week program from evaluation onward   PT Treatment/Interventions ADLs/Self Care Home Management;DME Instruction;Gait training;Stair training;Functional mobility training;Therapeutic  activities;Balance training;Therapeutic exercise;Neuromuscular re-education;Patient/family education;Orthotic Fit/Training;Manual techniques;Passive range of motion;Energy conservation;Taping;Splinting    PT Next Visit Plan maximal daily exercises, functional component task training, hierarchy task trianing, BIG walking, HEP/daily carryover assignments    PT Home Exercise Plan 7 LSVT BIG maximal daiy exercises, 5 functional component tasks, big walking and carryover assignment. Handout/packet provided with additional written instructions.  Updated carryover assignment on this date 01/02/2021 (see note)    Consulted and Agree with Plan of Care Patient             Patient will benefit from skilled therapeutic intervention in order to improve the following deficits and impairments:  Abnormal gait, Decreased activity tolerance, Decreased endurance, Decreased range of motion, Decreased knowledge of use of DME, Hypomobility, Improper body mechanics, Impaired UE functional use, Decreased balance, Decreased coordination, Decreased mobility, Difficulty walking, Impaired flexibility, Postural dysfunction  Visit Diagnosis: Other abnormalities of gait and mobility  Other lack of coordination  Unsteadiness on feet  Parkinson disease ([place])     Problem List There are no problems to display for this patient.   Zollie Pee, PT 01/02/2021, 8:48 PM  [place] MAIN [facility] SERVICES [address] [place], [place], [address] Phone:  [phone]   Fax:  [phone]  Name: Gardy Montanari MRN: [id number] Date of Birth: [birth year]

## 2021-01-03 ENCOUNTER — Ambulatory Visit: Payer: Medicare Other

## 2021-01-03 DIAGNOSIS — R2689 Other abnormalities of gait and mobility: Secondary | ICD-10-CM

## 2021-01-03 DIAGNOSIS — R278 Other lack of coordination: Secondary | ICD-10-CM

## 2021-01-03 DIAGNOSIS — G20 Parkinson's disease: Secondary | ICD-10-CM | POA: Diagnosis not present

## 2021-01-03 DIAGNOSIS — R2681 Unsteadiness on feet: Secondary | ICD-10-CM

## 2021-01-03 NOTE — Therapy (Signed)
[place] MAIN [facility] SERVICES [address] [place], [place], [address] Phone: [phone]   Fax:  [phone]  Physical Therapy Treatment  Patient Details  Name: Kyle Conner MRN: [id number] Date of Birth: Jun 01, 1950 Referring Provider (PT): Vladimir Crofts, MD   Encounter Date: 01/03/2021   PT End of Session - 01/03/21 1441     Visit Number 12    Number of Visits 17    Date for PT Re-Evaluation 01/16/21    Authorization Type LSVT BIG, eval performed 12/12/2020    Authorization Time Period 12/12/2020-01/16/2021    Authorization - Visit Number 2    Authorization - Number of Visits 17    PT Start Time 3817    PT Stop Time 1111    PT Time Calculation (min) 56 min    Equipment Utilized During Treatment Gait belt    Activity Tolerance Patient tolerated treatment well;No increased pain;Patient limited by fatigue    Behavior During Therapy [facility] for tasks assessed/performed             History reviewed. No pertinent past medical history.  History reviewed. No pertinent surgical history.  There were no vitals filed for this visit.   Subjective Assessment - 01/03/21 1440     Subjective Patient reports no falls or loss of balance since last session.  Went to his son's soccer game last night.  Has no plans for the weekend.    Pertinent History Pt is a pleasant 70 y/o male presenting to PT for LSVT BIG evaluation. Pt reports onset of Parkinson's sx occured over 3 years ago with noticing initial changes in gait and tremor of L hand. Pt reports gradual worsening of gait with shuffling, festination. Denies freezing episodes. Reports one fall over six months ago d/t gait festination. Pt also reports feelings of "stiffness" impacting his ability to perform transfers. He reports FOF with ambulating and ascending/descending steps. Pt does not use an AD, but does express that gait is his greatest concern, where he is unable to ambulate for long  periods of time and has difficulty with ambulating over uneven, compliant surfaces and up/down hills. Per chart other PMH is significant for: chronic lacunar infarct within L lentiform nucleus, sleep apnea (uses CPAP), Dupuytren Contracture LUE 5th digit, mild visual hallucinations, COVID (2020), uses hearing aids, blockage of coronary artery of heart, h/o cardiac cath (2008), HLD, HTN.    Limitations Walking;Writing;Standing;Sitting;House hold activities    How long can you sit comfortably? Pt reports >1 hr if chair is padded; pt reports if hard chair he is limited to 30 min d/t discomfort from numbness in LEs    How long can you stand comfortably? Pt reports if he is fatigued he is only able to stand 30-45 min.    How long can you walk comfortably? 50 yards    Diagnostic tests imging 09/05/2020 MR brain WO contrast: "IMPRESSION:  No evidence of acute intracranial abnormality.  Moderate generalized cerebral atrophy and cerebral white matter  chronic small vessel ischemic disease, progressed from the head CT  of 11/09/2009.  Small chronic lacunar infarct within the left lentiform nucleus.  Comparatively mild cerebellar atrophy.  Minimal right maxillary sinus mucosal thickening."    Patient Stated Goals Would like to walk without shuffling    Currently in Pain? No/denies               LSVT BIG Treatment:   Patient seen for LSVT Daily Session Maximal Daily Exercises  for facilitation/coordination of movement Sustained movements are designed to rescale the amplitude of movement output for generalization to daily functional activities.   Maximal Daily Exercises: Patient wearing 1.5# weights on B wrists Floor to Ceiling 10 with 10 second hold   Side to Side 10 Bilateral with 10 second hold Forward Step and Reach 15 Bilateral Sideways Step and Reach 15 Bilateral  Backwards Step and Reach 15 Bilateral Forwards Rock and Reach 15 Bilateral; Sideways Rock and Reach 15 Bilateral   Maximal Daily  Exercise Comments: Pt has now been able to progress volume of reps with all maximal daily exercises. Pt requires cuing 35-40% of the time for sequencing/technique with majority of exercises. Pt is able to sustain large amplitude movement throughout. The pt is still most challenged with backwards step and reach, requiring cuing with approx 50% of reps.    Functional Component Tasks - performance and progress: 5 reps for each;  Patient wearing 1.5 pound weights on wrists Sit to Stand x 5 from standard chair  Kneeling lunge to stand with UE support  3. Big step over object ; step over or in trouble walk 3 feet to comb, turn around cones/pivot walk back to hurdle, step over hurdle walk additional 3 feet to next: Repeat x5 trials 4. Walk and Turn (around object); 5. Walk on compliant surface -not performed this session  Comments: Pt requires minimal cuing with majority of functional component tasks successfully sustaining large amplitude movements. However, pt did have some increased difficulty with walking and turning today, reporting fatigue and with some hypokinetic movement.    Hierarchy Tasks - performance and progress: wearing 1.5# weights on wrists- Big reach to LE with big lift of LE into seated FABER position  Pt able to complete reps on BLE. Continues to be most challenged with initial BIG movement with LLE into faber position.  Ambulate throughout lower level of hospital with cues for direction encouragement for stopping when patient begins increased shuffle steppage.  Patient able to redirect shuffle steppage to large steps and large arm swings upon stopping and resetting.  Cues for large arm swing and foot taps required approximately 60% of the time especially during dual task.     Carryover Assignment: bring each LE into FABER position with big effort and use big effort to take off shoes once home   homework: Pt to perform all 7 LSVT BIG exercises, 5 Functional Component Tasks and big  walking. Updated carryover assignment.    Reports improvement with right hip symptoms following maximal daily exercises.   Pt educated throughout session about proper posture and technique with exercises. Improved exercise technique, movement at target joints, use of target muscles after min to mod verbal, visual, tactile cues.     Patient tolerated interventions well today.  Carryover assignments for home include large steps from couch to next room, large reach down to cat bowl and then to stand x5, and large steps while negotiating people at church.  Ambulation is challenging with dual task however patient is able to "reset" with additional cues to return to large step and large arm swing.  Patient is highly motivated for progression of LSVT program at this time.The pt will benefit from further skilled LSVT BIG treatments in order to improve mobility, gait, balance, and strength to increase ease and safety with ADLs.                       PT Education - 01/03/21 1441  Education Details Exercise technique body mechanics    Person(s) Educated Patient    Methods Explanation;Demonstration;Tactile cues;Verbal cues    Comprehension Verbalized understanding;Returned demonstration;Verbal cues required;Tactile cues required              PT Short Term Goals - 01/01/21 1941       PT SHORT TERM GOAL #1   Title Pt will be independent with LSVT BIG HEP in order to improve functional mobility and balance to decrease fall risk and improve function at home.    Baseline 9/6: initiated 9/21: pt still requires cuing for correct technique with some of his HEP    Time 3    Period Weeks    Status Partially Met    Target Date 01/02/21               PT Long Term Goals - 01/01/21 1944       PT LONG TERM GOAL #1   Title Patient will increase FOTO score to equal to or greater than 61 to demonstrate statistically significant improvement in mobility and quality of life.     Baseline 9/1: 52 9/21: 62    Time 5    Period Weeks    Status Achieved      PT LONG TERM GOAL #2   Title Patient will tolerate 5 seconds of single leg stance without loss of balance to improve ability to step over obstacles/up and down curbs safely.    Baseline 9/1: 2-3 sec BLEs; 9/21: pt achieves approx 4 seconds B    Time 5    Period Weeks    Status Partially Met    Target Date 01/16/21      PT LONG TERM GOAL #3   Title Pt will improve DGI by at least 3 points in order to demonstrate clinically significant improvement in balance and decreased risk for falls so pt can safely go on walks outdoors with his wife.    Baseline 9/1: 18/24; 9/21: deferred d/t time    Time 5    Period Weeks    Status On-going    Target Date 01/16/21      PT LONG TERM GOAL #4   Title Patient will increase 10 meter walk test to >1.17m/s as to improve gait speed for better community ambulation.    Baseline 9/1: 0.89 m/s; 9/21: 1.25 m/s    Time 5    Period Weeks    Status Achieved    Target Date 01/16/21                   Plan - 01/03/21 1451     Clinical Impression Statement Patient tolerated interventions well today.  Carryover assignments for home include large steps from couch to next room, large reach down to cat bowl and then to stand x5, and large steps while negotiating people at church.  Ambulation is challenging with dual task however patient is able to "reset" with additional cues to return to large step and large arm swing.  Patient is highly motivated for progression of LSVT program at this time.The pt will benefit from further skilled LSVT BIG treatments in order to improve mobility, gait, balance, and strength to increase ease and safety with ADLs.    Personal Factors and Comorbidities Age;Comorbidity 1;Comorbidity 2;Comorbidity 3+;Fitness;Time since onset of injury/illness/exacerbation    Comorbidities chronic lacunar infarct within L lentiform nucleus, hx of COVID19 (2020), HTN,  duputyren contracture LUE 5th digit,    Examination-Activity Limitations Hygiene/Grooming;Bend;Stand;Sit;Locomotion Level;Transfers;Stairs;Dressing;Squat  Examination-Participation Restrictions Community Activity;Shop;Yard Work;Cleaning;Laundry    Stability/Clinical Decision Making Evolving/Moderate complexity    Rehab Potential Good    PT Frequency 4x / week    PT Duration Other (comment)   coverage for 5 weeks to cover the full 4 week program from evaluation onward   PT Treatment/Interventions ADLs/Self Care Home Management;DME Instruction;Gait training;Stair training;Functional mobility training;Therapeutic activities;Balance training;Therapeutic exercise;Neuromuscular re-education;Patient/family education;Orthotic Fit/Training;Manual techniques;Passive range of motion;Energy conservation;Taping;Splinting    PT Next Visit Plan maximal daily exercises, functional component task training, hierarchy task trianing, BIG walking, HEP/daily carryover assignments    PT Home Exercise Plan 7 LSVT BIG maximal daiy exercises, 5 functional component tasks, big walking and carryover assignment. Handout/packet provided with additional written instructions.  Updated carryover assignment on this date 01/02/2021 (see note)    Consulted and Agree with Plan of Care Patient             Patient will benefit from skilled therapeutic intervention in order to improve the following deficits and impairments:  Abnormal gait, Decreased activity tolerance, Decreased endurance, Decreased range of motion, Decreased knowledge of use of DME, Hypomobility, Improper body mechanics, Impaired UE functional use, Decreased balance, Decreased coordination, Decreased mobility, Difficulty walking, Impaired flexibility, Postural dysfunction  Visit Diagnosis: Other abnormalities of gait and mobility  Other lack of coordination  Unsteadiness on feet     Problem List There are no problems to display for this  patient.   Janna Arch, PT, DPT  01/03/2021, 2:54 PM  [place] MAIN [facility] SERVICES [address] [place], [place], [address] Phone: [phone]   Fax:  [phone]  Name: D'Arcy Abraha MRN: [id number] Date of Birth: February 08, 1951

## 2021-01-07 ENCOUNTER — Other Ambulatory Visit: Payer: Self-pay

## 2021-01-07 ENCOUNTER — Ambulatory Visit: Payer: Medicare Other

## 2021-01-07 DIAGNOSIS — R2681 Unsteadiness on feet: Secondary | ICD-10-CM

## 2021-01-07 DIAGNOSIS — R278 Other lack of coordination: Secondary | ICD-10-CM

## 2021-01-07 DIAGNOSIS — G20 Parkinson's disease: Secondary | ICD-10-CM | POA: Diagnosis not present

## 2021-01-07 DIAGNOSIS — R2689 Other abnormalities of gait and mobility: Secondary | ICD-10-CM

## 2021-01-07 NOTE — Therapy (Signed)
[place] [facility] SERVICES [address] [place], [place], [address] Phone: [phone]   Fax:  [phone]  Physical Therapy Treatment  Patient Details  Name: Kyle Conner MRN: [id number] Date of Birth: November 02, 1950 Referring Provider (PT): Vladimir Crofts, MD   Encounter Date: 01/07/2021   PT End of Session - 01/07/21 1315     Visit Number 13    Number of Visits 17    Date for PT Re-Evaluation 01/16/21    Authorization Type LSVT BIG, eval performed 12/12/2020    Authorization Time Period 12/12/2020-01/16/2021    Authorization - Visit Number 2    Authorization - Number of Visits 17    PT Start Time 1016    PT Stop Time 1114    PT Time Calculation (min) 58 min    Equipment Utilized During Treatment Gait belt    Activity Tolerance Patient tolerated treatment well;No increased pain    Behavior During Therapy [facility] for tasks assessed/performed             History reviewed. No pertinent past medical history.  History reviewed. No pertinent surgical history.  There were no vitals filed for this visit.   Subjective Assessment - 01/07/21 1015     Subjective Pt reports doing HEP. No difficulty ambulating outside at his son's soccer games. He has not yet attempted long walk with his spouse.    Pertinent History Pt is a pleasant 70 y/o male presenting to PT for LSVT BIG evaluation. Pt reports onset of Parkinson's sx occured over 3 years ago with noticing initial changes in gait and tremor of L hand. Pt reports gradual worsening of gait with shuffling, festination. Denies freezing episodes. Reports one fall over six months ago d/t gait festination. Pt also reports feelings of "stiffness" impacting his ability to perform transfers. He reports FOF with ambulating and ascending/descending steps. Pt does not use an AD, but does express that gait is his greatest concern, where he is unable to ambulate for long periods of time and has difficulty  with ambulating over uneven, compliant surfaces and up/down hills. Per chart other PMH is significant for: chronic lacunar infarct within L lentiform nucleus, sleep apnea (uses CPAP), Dupuytren Contracture LUE 5th digit, mild visual hallucinations, COVID (2020), uses hearing aids, blockage of coronary artery of heart, h/o cardiac cath (2008), HLD, HTN.    Limitations Walking;Writing;Standing;Sitting;House hold activities    How long can you sit comfortably? Pt reports >1 hr if chair is padded; pt reports if hard chair he is limited to 30 min d/t discomfort from numbness in LEs    How long can you stand comfortably? Pt reports if he is fatigued he is only able to stand 30-45 min.    How long can you walk comfortably? 50 yards    Diagnostic tests imging 09/05/2020 MR brain WO contrast: "IMPRESSION:  No evidence of acute intracranial abnormality.  Moderate generalized cerebral atrophy and cerebral white matter  chronic small vessel ischemic disease, progressed from the head CT  of 11/09/2009.  Small chronic lacunar infarct within the left lentiform nucleus.  Comparatively mild cerebellar atrophy.  Minimal right maxillary sinus mucosal thickening."    Patient Stated Goals Would like to walk without shuffling    Currently in Pain? No/denies            LSVT BIG Treatment:   Patient seen for LSVT Daily Session Maximal Daily Exercises for facilitation/coordination of movement Sustained movements are designed to rescale  the amplitude of movement output for generalization to daily functional activities.   Maximal Daily Exercises: Patient wearing 1.5# weights on B wrists Floor to Ceiling 10 with 10 second hold;  <20% cues    Side to Side 10 Bilateral with 10 second hold 5% cuing  Forward Step and Reach 20 Bilateral <5% cuing  Sideways Step and Reach 20 Bilateral  <5% cuing; mainly for finger abduction, larger force of step  Backwards Step and Reach 15 Bilateral; 50% cuing, able to perform second side  indep Forwards Rock and Reach 15 Bilateral; 95% indep providing some knowledge of results Sideways Rock and Reach 15 Bilateral; minimal cuing for finger abductions and shoulder abduction, otherwise independent    Maximal Daily Exercise Comments: Patient progresses volume of repetitions performed.  The patient is performing in a busy clinic today providing a high level of distraction.  Patient is increasing independence with technique.  He is also showing improved endurance.  Areas to work on or sustaining finger abduction throughout .  PT is mainly providing knowledge of results this session.     Functional Component Tasks - performance and progress: 5 reps for each;  Patient wearing 1.5 pound weights on wrists Sit to Stand x 5 from standard chair  Kneeling to stand with UE support  3. Big step over object ;  4. Walk and Turn (around object); 5. Walk on compliant surface  Comments: Patient largely independent technique with all tasks.  The patient's excessively sustained large amplitude movement with majority of tasks.  Patient requires minimal cueing for big turn for 2/5 reps.     Hierarchy Tasks - performance and progress: wearing 1.5# weights on wrists- Big reach to LE with big lift of LE into seated FABER position, removes shoes and socks big and puts them back on big while maintaining position for at least 5 seconds.  Progression to ambulating for long walk outdoors.  Patient ambulates over sidewalk up and down curb over uneven ground and over grass and cement surfaces, as well as up-and-down slopes.  No postural instability noted.  Patient requires 1 rest break.  Does require minimal cueing for left arm swing  Gait -1.5 pound weights on wrists . Incorporated into hierarchy task of walking outdoors (see above).  Addition of dual task of counting and naming animals to increase complexity, address shuffling with distraction.    Carryover Assignment: Doristine Devoid wife with big greeting at soccer  game, walk big, and use big arm swing especially on left side   homework: Pt to perform all 7 LSVT BIG exercises, 5 Functional Component Tasks and big walking. Updated carryover assignment.    Pt educated throughout session about proper posture and technique with exercises. Improved exercise technique, movement at target joints, use of target muscles after min to mod verbal, visual, tactile cues.    note: Portions of this document were prepared using Dragon voice recognition software and although reviewed may contain unintentional dictation errors in syntax, grammar, or spelling.    PT Education - 01/07/21 1314     Education Details exercise technique, body mechanics, carryover task    Person(s) Educated Patient    Methods Explanation;Demonstration;Verbal cues;Tactile cues;Handout    Comprehension Verbalized understanding;Returned demonstration;Verbal cues required;Need further instruction              PT Short Term Goals - 01/01/21 1941       PT SHORT TERM GOAL #1   Title Pt will be independent with LSVT BIG HEP in order  to improve functional mobility and balance to decrease fall risk and improve function at home.    Baseline 9/6: initiated 9/21: pt still requires cuing for correct technique with some of his HEP    Time 3    Period Weeks    Status Partially Met    Target Date 01/02/21               PT Long Term Goals - 01/01/21 1944       PT LONG TERM GOAL #1   Title Patient will increase FOTO score to equal to or greater than 61 to demonstrate statistically significant improvement in mobility and quality of life.    Baseline 9/1: 52 9/21: 62    Time 5    Period Weeks    Status Achieved      PT LONG TERM GOAL #2   Title Patient will tolerate 5 seconds of single leg stance without loss of balance to improve ability to step over obstacles/up and down curbs safely.    Baseline 9/1: 2-3 sec BLEs; 9/21: pt achieves approx 4 seconds B    Time 5    Period Weeks     Status Partially Met    Target Date 01/16/21      PT LONG TERM GOAL #3   Title Pt will improve DGI by at least 3 points in order to demonstrate clinically significant improvement in balance and decreased risk for falls so pt can safely go on walks outdoors with his wife.    Baseline 9/1: 18/24; 9/21: deferred d/t time    Time 5    Period Weeks    Status On-going    Target Date 01/16/21      PT LONG TERM GOAL #4   Title Patient will increase 10 meter walk test to >1.64ms as to improve gait speed for better community ambulation.    Baseline 9/1: 0.89 m/s; 9/21: 1.25 m/s    Time 5    Period Weeks    Status Achieved    Target Date 01/16/21                   Plan - 01/07/21 1315     Clinical Impression Statement Patient continues to be able to progress volume of reps performed with maximal daily exercises, indicating improved activity tolerance.  The patient also performs the majority of maximal daily exercises with minimal cueing this session, indicating increased independence with HEP and improved technique.  Gait training largely focused on ambulating over grass and uneven surfaces.  Patient did not exhibit any postural instability and only required 1 rest break.  However, he did require some cueing for left arm swing.  The patient will benefit from further skilled LSVT BIG PT treatments to improve mobility, gait, balance, and strength to increase ease and safety with ADLs    Personal Factors and Comorbidities Age;Comorbidity 1;Comorbidity 2;Comorbidity 3+;Fitness;Time since onset of injury/illness/exacerbation    Comorbidities chronic lacunar infarct within L lentiform nucleus, hx of COVID19 (2020), HTN, duputyren contracture LUE 5th digit,    Examination-Activity Limitations Hygiene/Grooming;Bend;Stand;Sit;Locomotion Level;Transfers;Stairs;Dressing;Squat    Examination-Participation Restrictions Community Activity;Shop;Yard Work;Cleaning;Laundry    Stability/Clinical Decision  Making Evolving/Moderate complexity    Rehab Potential Good    PT Frequency 4x / week    PT Duration Other (comment)   coverage for 5 weeks to cover the full 4 week program from evaluation onward   PT Treatment/Interventions ADLs/Self Care Home Management;DME Instruction;Gait training;Stair training;Functional mobility training;Therapeutic activities;Balance training;Therapeutic exercise;Neuromuscular  re-education;Patient/family education;Orthotic Fit/Training;Manual techniques;Passive range of motion;Energy conservation;Taping;Splinting    PT Next Visit Plan maximal daily exercises, functional component task training, hierarchy task trianing, BIG walking, HEP/daily carryover assignments    PT Home Exercise Plan 7 LSVT BIG maximal daiy exercises, 5 functional component tasks, big walking and carryover assignment. Handout/packet provided with additional written instructions.  Updated carryover assignment on this date 01/07/2021 (see note)    Consulted and Agree with Plan of Care Patient             Patient will benefit from skilled therapeutic intervention in order to improve the following deficits and impairments:  Abnormal gait, Decreased activity tolerance, Decreased endurance, Decreased range of motion, Decreased knowledge of use of DME, Hypomobility, Improper body mechanics, Impaired UE functional use, Decreased balance, Decreased coordination, Decreased mobility, Difficulty walking, Impaired flexibility, Postural dysfunction  Visit Diagnosis: Other abnormalities of gait and mobility  Other lack of coordination  Unsteadiness on feet  Parkinson disease ([place])     Problem List There are no problems to display for this patient.   Zollie Pee, PT 01/07/2021, 1:27 PM  [place] [facility] SERVICES [address] [place], [place], [address] Phone: [phone]   Fax:  [phone]  Name: Kristoph Sattler MRN: [id number] Date of Birth:  Nov 11, 1950

## 2021-01-08 ENCOUNTER — Ambulatory Visit: Payer: Medicare Other

## 2021-01-08 DIAGNOSIS — G20A1 Parkinson's disease without dyskinesia, without mention of fluctuations: Secondary | ICD-10-CM

## 2021-01-08 DIAGNOSIS — R2689 Other abnormalities of gait and mobility: Secondary | ICD-10-CM

## 2021-01-08 DIAGNOSIS — G20 Parkinson's disease: Secondary | ICD-10-CM

## 2021-01-08 DIAGNOSIS — R278 Other lack of coordination: Secondary | ICD-10-CM

## 2021-01-08 DIAGNOSIS — R2681 Unsteadiness on feet: Secondary | ICD-10-CM

## 2021-01-08 NOTE — Therapy (Signed)
[place] [facility] SERVICES [address] [place], [place], [address] Phone: [phone]   Fax:  [phone]  Physical Therapy Treatment  Patient Details  Name: Kyle Conner MRN: [id number] Date of Birth: 07-12-1950 Referring Provider (PT): Vladimir Crofts, MD   Encounter Date: 01/08/2021   PT End of Session - 01/08/21 1323     Visit Number 14    Number of Visits 17    Date for PT Re-Evaluation 01/16/21    Authorization Type LSVT BIG, eval performed 12/12/2020    Authorization Time Period 12/12/2020-01/16/2021    Authorization - Visit Number 2    Authorization - Number of Visits 17    PT Start Time 3846    PT Stop Time 1112    PT Time Calculation (min) 57 min    Equipment Utilized During Treatment Gait belt    Activity Tolerance Patient tolerated treatment well;No increased pain    Behavior During Therapy [facility] for tasks assessed/performed             History reviewed. No pertinent past medical history.  History reviewed. No pertinent surgical history.  There were no vitals filed for this visit.   Subjective Assessment - 01/08/21 1017     Subjective Pt reports son's soccer game went well. He had to walk far to get to the game and says it went OK.    Pertinent History Pt is a pleasant 70 y/o male presenting to PT for LSVT BIG evaluation. Pt reports onset of Parkinson's sx occured over 3 years ago with noticing initial changes in gait and tremor of L hand. Pt reports gradual worsening of gait with shuffling, festination. Denies freezing episodes. Reports one fall over six months ago d/t gait festination. Pt also reports feelings of "stiffness" impacting his ability to perform transfers. He reports FOF with ambulating and ascending/descending steps. Pt does not use an AD, but does express that gait is his greatest concern, where he is unable to ambulate for long periods of time and has difficulty with ambulating over uneven,  compliant surfaces and up/down hills. Per chart other PMH is significant for: chronic lacunar infarct within L lentiform nucleus, sleep apnea (uses CPAP), Dupuytren Contracture LUE 5th digit, mild visual hallucinations, COVID (2020), uses hearing aids, blockage of coronary artery of heart, h/o cardiac cath (2008), HLD, HTN.    Limitations Walking;Writing;Standing;Sitting;House hold activities    How long can you sit comfortably? Pt reports >1 hr if chair is padded; pt reports if hard chair he is limited to 30 min d/t discomfort from numbness in LEs    How long can you stand comfortably? Pt reports if he is fatigued he is only able to stand 30-45 min.    How long can you walk comfortably? 50 yards    Diagnostic tests imging 09/05/2020 MR brain WO contrast: "IMPRESSION:  No evidence of acute intracranial abnormality.  Moderate generalized cerebral atrophy and cerebral white matter  chronic small vessel ischemic disease, progressed from the head CT  of 11/09/2009.  Small chronic lacunar infarct within the left lentiform nucleus.  Comparatively mild cerebellar atrophy.  Minimal right maxillary sinus mucosal thickening."    Patient Stated Goals Would like to walk without shuffling    Currently in Pain? No/denies             LSVT BIG Treatment:   Patient seen for LSVT Daily Session Maximal Daily Exercises for facilitation/coordination of movement Sustained movements are designed to rescale  the amplitude of movement output for generalization to daily functional activities.   Maximal Daily Exercises: Patient wearing 1.5# weights on B wrists Floor to Ceiling 10 with 10 second hold;   Side to Side 10 Bilateral with 10 second hold Forward Step and Reach 20 Bilateral  Sideways Step and Reach 20 Bilateral   Backwards Step and Reach 20 Bilateral Forwards Rock and Reach 20 Bilateral Sideways Rock and Reach 20 Bilateral   Maximal Daily Exercise Comments: Pt again able to progress volume of reps performed  with maximal daily exercises, now perform 20 reps of 3-7. Pt exhibits independence with exercises as he is able to teach back technique to PT. Pt is >95% independent. Minor adjustments for maintaining finger abduction.    Functional Component Tasks - performance and progress: 5 reps for each;  Patient wearing 1.5 pound weights on wrists Sit to Stand x 5 from standard chair  Kneeling to stand with UE support  3. Big step over object   4. Walk and Turn (around object) 5. Walk on compliant surface  Comments: Pt able to teach back STS technique. Pt exhibits independence with all tasks, improved ability to sustain large amplitude movement with turning.     Hierarchy Tasks - performance and progress: wearing 1.5# weights on wrists- Big reach to LE with big lift of LE into seated FABER position, removes shoes and socks big and puts them back on big while maintaining position for at least 5 seconds: Pt able to maintain LLE in FABER position.   Progression to ambulating for long walk outdoors.  Patient ambulates over sidewalk up and down curb over uneven ground and over grass and cement surfaces, as well as up-and-down slopes.  No postural instability noted.  Patient requires 3 rest break d/t shuffling. Pt is able to tolerate longer overall duration of ambulation outdoors. The pt still does require TC cueing for left arm swing   Gait -1.5 pound weights on wrists . Incorporated into hierarchy task of walking outdoors (see above). Emphasis continues to be on increasing and sustaining amplitude of L-sided movements.    Carryover Assignment: Pt to get out of bed BIG with large amplitude sit up and stand up, he is to walk big into another room.    homework: Pt to perform all 7 LSVT BIG exercises, 5 Functional Component Tasks and big walking. Updated carryover assignment.    Pt educated throughout session about proper posture and technique with exercises. Improved exercise technique, movement at target joints,  use of target muscles after min to mod verbal, visual, tactile cues.     note: Portions of this document were prepared using Dragon voice recognition software and although reviewed may contain unintentional dictation errors in syntax, grammar, or spelling.      PT Education - 01/08/21 1323     Education Details exercise technique, body mechanics, updated carryover assignment    Person(s) Educated Patient    Methods Explanation;Demonstration;Verbal cues;Handout    Comprehension Verbalized understanding;Returned demonstration;Need further instruction              PT Short Term Goals - 01/01/21 1941       PT SHORT TERM GOAL #1   Title Pt will be independent with LSVT BIG HEP in order to improve functional mobility and balance to decrease fall risk and improve function at home.    Baseline 9/6: initiated 9/21: pt still requires cuing for correct technique with some of his HEP    Time 3  Period Weeks    Status Partially Met    Target Date 01/02/21               PT Long Term Goals - 01/01/21 1944       PT LONG TERM GOAL #1   Title Patient will increase FOTO score to equal to or greater than 61 to demonstrate statistically significant improvement in mobility and quality of life.    Baseline 9/1: 52 9/21: 62    Time 5    Period Weeks    Status Achieved      PT LONG TERM GOAL #2   Title Patient will tolerate 5 seconds of single leg stance without loss of balance to improve ability to step over obstacles/up and down curbs safely.    Baseline 9/1: 2-3 sec BLEs; 9/21: pt achieves approx 4 seconds B    Time 5    Period Weeks    Status Partially Met    Target Date 01/16/21      PT LONG TERM GOAL #3   Title Pt will improve DGI by at least 3 points in order to demonstrate clinically significant improvement in balance and decreased risk for falls so pt can safely go on walks outdoors with his wife.    Baseline 9/1: 18/24; 9/21: deferred d/t time    Time 5    Period Weeks     Status On-going    Target Date 01/16/21      PT LONG TERM GOAL #4   Title Patient will increase 10 meter walk test to >1.45m/s as to improve gait speed for better community ambulation.    Baseline 9/1: 0.89 m/s; 9/21: 1.25 m/s    Time 5    Period Weeks    Status Achieved    Target Date 01/16/21                   Plan - 01/08/21 1331     Clinical Impression Statement Pt has now progressed remaining maximal daily exercises (3-7) to 20 reps while wearing 1.5# weights on BUEs, requiring two rest breaks throughout. This indicates increased activity tolerance. The pt was also able to teach back technique, indicating independence with HEP. Pt remaining challenge is endurance with ambulation where pt will start to shuffle with decreased postural stability as he fatigues. PT instructed pt on continued activity once pt completes LSVT program, with an emphasis on endurance training on a stationary bike at his gym. Pt verbalized understanding. The pt will benefit from further LSVT BIG PT treatments to improve mobility, gait, balance and strength to increase ease and safety with ADLs.    Personal Factors and Comorbidities Age;Comorbidity 1;Comorbidity 2;Comorbidity 3+;Fitness;Time since onset of injury/illness/exacerbation    Comorbidities chronic lacunar infarct within L lentiform nucleus, hx of COVID19 (2020), HTN, duputyren contracture LUE 5th digit,    Examination-Activity Limitations Hygiene/Grooming;Bend;Stand;Sit;Locomotion Level;Transfers;Stairs;Dressing;Squat    Examination-Participation Restrictions Community Activity;Shop;Yard Work;Cleaning;Laundry    Stability/Clinical Decision Making Evolving/Moderate complexity    Rehab Potential Good    PT Frequency 4x / week    PT Duration Other (comment)   coverage for 5 weeks to cover the full 4 week program from evaluation onward   PT Treatment/Interventions ADLs/Self Care Home Management;DME Instruction;Gait training;Stair  training;Functional mobility training;Therapeutic activities;Balance training;Therapeutic exercise;Neuromuscular re-education;Patient/family education;Orthotic Fit/Training;Manual techniques;Passive range of motion;Energy conservation;Taping;Splinting    PT Next Visit Plan maximal daily exercises, functional component task training, hierarchy task trianing, BIG walking, HEP/daily carryover assignments    PT Home Exercise  Plan 7 LSVT BIG maximal daiy exercises, 5 functional component tasks, big walking and carryover assignment. Handout/packet provided with additional written instructions.  Updated carryover assignment on this date 01/08/2021 (see note)    Consulted and Agree with Plan of Care Patient             Patient will benefit from skilled therapeutic intervention in order to improve the following deficits and impairments:  Abnormal gait, Decreased activity tolerance, Decreased endurance, Decreased range of motion, Decreased knowledge of use of DME, Hypomobility, Improper body mechanics, Impaired UE functional use, Decreased balance, Decreased coordination, Decreased mobility, Difficulty walking, Impaired flexibility, Postural dysfunction  Visit Diagnosis: Other abnormalities of gait and mobility  Unsteadiness on feet  Other lack of coordination  Parkinson disease ([place])     Problem List There are no problems to display for this patient.   Zollie Pee, PT 01/08/2021, 1:36 PM  [place] MAIN [facility] SERVICES [address] [place], [place], [address] Phone: [phone]   Fax:  [phone]  Name: Orlondo Holycross MRN: [id number] Date of Birth: 1951/03/24

## 2021-01-09 ENCOUNTER — Other Ambulatory Visit: Payer: Self-pay

## 2021-01-09 ENCOUNTER — Ambulatory Visit: Payer: Medicare Other

## 2021-01-09 DIAGNOSIS — M6281 Muscle weakness (generalized): Secondary | ICD-10-CM

## 2021-01-09 DIAGNOSIS — R2689 Other abnormalities of gait and mobility: Secondary | ICD-10-CM

## 2021-01-09 DIAGNOSIS — G20 Parkinson's disease: Secondary | ICD-10-CM

## 2021-01-09 DIAGNOSIS — R278 Other lack of coordination: Secondary | ICD-10-CM

## 2021-01-09 DIAGNOSIS — R2681 Unsteadiness on feet: Secondary | ICD-10-CM

## 2021-01-09 NOTE — Therapy (Signed)
[place] [facility] SERVICES [address] [place], [place], [address] Phone: [phone]   Fax:  [phone]  Physical Therapy Treatment  Patient Details  Name: Kyle Conner MRN: [id number] Date of Birth: 27-Mar-1951 Referring Provider (PT): Vladimir Crofts, MD   Encounter Date: 01/09/2021   PT End of Session - 01/09/21 1207     Visit Number 15    Number of Visits 17    Date for PT Re-Evaluation 01/16/21    Authorization Type LSVT BIG, eval performed 12/12/2020    Authorization Time Period 12/12/2020-01/16/2021    Authorization - Visit Number 2    Authorization - Number of Visits 17    PT Start Time 1101    PT Stop Time 1200    PT Time Calculation (min) 59 min    Equipment Utilized During Treatment Gait belt    Activity Tolerance Patient tolerated treatment well;No increased pain    Behavior During Therapy [facility] for tasks assessed/performed             History reviewed. No pertinent past medical history.  History reviewed. No pertinent surgical history.  There were no vitals filed for this visit.   Subjective Assessment - 01/09/21 1206     Subjective Pt reports things have been going well with home exercises. Pt reports no pains.    Pertinent History Pt is a pleasant 70 y/o male presenting to PT for LSVT BIG evaluation. Pt reports onset of Parkinson's sx occured over 3 years ago with noticing initial changes in gait and tremor of L hand. Pt reports gradual worsening of gait with shuffling, festination. Denies freezing episodes. Reports one fall over six months ago d/t gait festination. Pt also reports feelings of "stiffness" impacting his ability to perform transfers. He reports FOF with ambulating and ascending/descending steps. Pt does not use an AD, but does express that gait is his greatest concern, where he is unable to ambulate for long periods of time and has difficulty with ambulating over uneven, compliant surfaces and  up/down hills. Per chart other PMH is significant for: chronic lacunar infarct within L lentiform nucleus, sleep apnea (uses CPAP), Dupuytren Contracture LUE 5th digit, mild visual hallucinations, COVID (2020), uses hearing aids, blockage of coronary artery of heart, h/o cardiac cath (2008), HLD, HTN.    Limitations Walking;Writing;Standing;Sitting;House hold activities    How long can you sit comfortably? Pt reports >1 hr if chair is padded; pt reports if hard chair he is limited to 30 min d/t discomfort from numbness in LEs    How long can you stand comfortably? Pt reports if he is fatigued he is only able to stand 30-45 min.    How long can you walk comfortably? 50 yards    Diagnostic tests imging 09/05/2020 MR brain WO contrast: "IMPRESSION:  No evidence of acute intracranial abnormality.  Moderate generalized cerebral atrophy and cerebral white matter  chronic small vessel ischemic disease, progressed from the head CT  of 11/09/2009.  Small chronic lacunar infarct within the left lentiform nucleus.  Comparatively mild cerebellar atrophy.  Minimal right maxillary sinus mucosal thickening."    Patient Stated Goals Would like to walk without shuffling    Currently in Pain? No/denies              LSVT BIG Treatment:   Patient seen for LSVT Daily Session Maximal Daily Exercises for facilitation/coordination of movement Sustained movements are designed to rescale the amplitude of movement output for generalization  to daily functional activities.   Maximal Daily Exercises: Patient wearing 1.5# weights on B wrists Floor to Ceiling 10 with 10 second hold;   Side to Side 10 Bilateral with 10 second hold Forward Step and Reach 20 Bilateral  Sideways Step and Reach 20 Bilateral   Backwards Step and Reach 20 Bilateral  <50% cuing Forwards Rock and Reach 20 Bilateral Sideways Rock and Reach 20 Bilateral   Maximal Daily Exercise Comments: Pt able to perform increased volume of reps without rest  breaks. He is continuing to perform 20 reps of exercises 3-7. Overall pt independent with exercises with the exception being #5, where he requires <50% cuing: minor adjustments for maintaining finger abduction, shoulder extension.    Functional Component Tasks - performance and progress: 5 reps for each;  Patient wearing 1.5 pound weights on wrists Sit to Stand x 5 from standard chair  Kneeling to stand with UE support  3. Big step over object   4. Walk and Turn (around object) 5. Walk on compliant surface  Comments: Pt independent with exercise technique for all functional component tasks, sustains increased speed and amplitude of movement throughout.    Hierarchy Tasks - performance and progress: wearing 1.5# weights on wrists- Big reach to LE with big lift of LE into seated FABER position, removes shoes and socks big and puts them back on big while maintaining position for at least 5 seconds: Pt independent with task. Improved tolerance to maintain LLE in FABER position.  Progression to ambulating for long walk outdoors.  Patient ambulates over sidewalk up and down curb over uneven ground and over grass and cement surfaces, as well as up-and-down slopes with addition of dual task (naming foods, animals).  Pt initiates technique without PT prompting at beginning of gait training, exhibiting large amplitude arm-swing and big steps. Pt still challenged with fatigue, requiring rest breaks. Pt also challenged with ability to maintain technique with dual task.    Gait -1.5 pound weights on wrists . Incorporated into hierarchy task of walking outdoors (see above), dual task added. Pt with increased festinating gait with dual task.    Carryover Assignment: Turn BIG in grocery store when going to reach BIG for item on shelf.    homework: Pt to perform all 7 LSVT BIG exercises, 5 Functional Component Tasks and big walking. Updated carryover assignment.    Pt educated throughout session about proper  posture and technique with exercises. Improved exercise technique, movement at target joints, use of target muscles after min to mod verbal, visual, tactile cues.       PT Education - 01/09/21 1206     Education Details exercise technique, body mechanics, updated carryover assignment    Person(s) Educated Patient    Methods Explanation;Demonstration;Verbal cues;Handout    Comprehension Verbalized understanding;Returned demonstration;Need further instruction;Verbal cues required              PT Short Term Goals - 01/01/21 1941       PT SHORT TERM GOAL #1   Title Pt will be independent with LSVT BIG HEP in order to improve functional mobility and balance to decrease fall risk and improve function at home.    Baseline 9/6: initiated 9/21: pt still requires cuing for correct technique with some of his HEP    Time 3    Period Weeks    Status Partially Met    Target Date 01/02/21               PT  Long Term Goals - 01/01/21 1944       PT LONG TERM GOAL #1   Title Patient will increase FOTO score to equal to or greater than 61 to demonstrate statistically significant improvement in mobility and quality of life.    Baseline 9/1: 52 9/21: 62    Time 5    Period Weeks    Status Achieved      PT LONG TERM GOAL #2   Title Patient will tolerate 5 seconds of single leg stance without loss of balance to improve ability to step over obstacles/up and down curbs safely.    Baseline 9/1: 2-3 sec BLEs; 9/21: pt achieves approx 4 seconds B    Time 5    Period Weeks    Status Partially Met    Target Date 01/16/21      PT LONG TERM GOAL #3   Title Pt will improve DGI by at least 3 points in order to demonstrate clinically significant improvement in balance and decreased risk for falls so pt can safely go on walks outdoors with his wife.    Baseline 9/1: 18/24; 9/21: deferred d/t time    Time 5    Period Weeks    Status On-going    Target Date 01/16/21      PT LONG TERM GOAL #4    Title Patient will increase 10 meter walk test to >1.72m/s as to improve gait speed for better community ambulation.    Baseline 9/1: 0.89 m/s; 9/21: 1.25 m/s    Time 5    Period Weeks    Status Achieved    Target Date 01/16/21                   Plan - 01/09/21 1207     Clinical Impression Statement Pt independent overall with technique with all exercises. He still requires some cuing with backwards step and reach, where pt was able to perform second set (other side) independently. Remaining corrections were provided intermittently for finger abduction and shoulder extension. Pt instructed that he is to be reassessed on goals tomorrow for final session along with plans for continuing gains beyond PT. The pt will benefit from further LSVT BIG PT treatments to improve mobility, gait, balance and strength to decrease fall risk and increase QOL.    Personal Factors and Comorbidities Age;Comorbidity 1;Comorbidity 2;Comorbidity 3+;Fitness;Time since onset of injury/illness/exacerbation    Comorbidities chronic lacunar infarct within L lentiform nucleus, hx of COVID19 (2020), HTN, duputyren contracture LUE 5th digit,    Examination-Activity Limitations Hygiene/Grooming;Bend;Stand;Sit;Locomotion Level;Transfers;Stairs;Dressing;Squat    Examination-Participation Restrictions Community Activity;Shop;Yard Work;Cleaning;Laundry    Stability/Clinical Decision Making Evolving/Moderate complexity    Rehab Potential Good    PT Frequency 4x / week    PT Duration Other (comment)   coverage for 5 weeks to cover the full 4 week program from evaluation onward   PT Treatment/Interventions ADLs/Self Care Home Management;DME Instruction;Gait training;Stair training;Functional mobility training;Therapeutic activities;Balance training;Therapeutic exercise;Neuromuscular re-education;Patient/family education;Orthotic Fit/Training;Manual techniques;Passive range of motion;Energy conservation;Taping;Splinting    PT  Next Visit Plan maximal daily exercises, functional component task training, hierarchy task trianing, BIG walking, HEP/daily carryover assignments    PT Home Exercise Plan 7 LSVT BIG maximal daiy exercises, 5 functional component tasks, big walking and carryover assignment. Handout/packet provided with additional written instructions.  Updated carryover assignment on this date 01/09/2021 (see note)    Consulted and Agree with Plan of Care Patient  Patient will benefit from skilled therapeutic intervention in order to improve the following deficits and impairments:  Abnormal gait, Decreased activity tolerance, Decreased endurance, Decreased range of motion, Decreased knowledge of use of DME, Hypomobility, Improper body mechanics, Impaired UE functional use, Decreased balance, Decreased coordination, Decreased mobility, Difficulty walking, Impaired flexibility, Postural dysfunction  Visit Diagnosis: Other abnormalities of gait and mobility  Other lack of coordination  Unsteadiness on feet  Muscle weakness (generalized)  Parkinson disease ([place])     Problem List There are no problems to display for this patient.   Zollie Pee, PT 01/09/2021, 12:18 PM  [place] [facility] SERVICES [address] [place], [place], [address] Phone: [phone]   Fax:  [phone]  Name: Kyle Conner MRN: [id number] Date of Birth: 03/18/1951

## 2021-01-10 ENCOUNTER — Ambulatory Visit: Payer: Medicare Other

## 2021-01-10 DIAGNOSIS — G20 Parkinson's disease: Secondary | ICD-10-CM

## 2021-01-10 DIAGNOSIS — R2689 Other abnormalities of gait and mobility: Secondary | ICD-10-CM

## 2021-01-10 DIAGNOSIS — R278 Other lack of coordination: Secondary | ICD-10-CM

## 2021-01-10 NOTE — Therapy (Signed)
[place] MAIN [facility] SERVICES [address] [place], [place], [address] Phone: [phone]   Fax:  [phone]  Physical Therapy Treatment  Patient Details  Name: Kyle Conner MRN: [id number] Date of Birth: 08-04-1950 Referring Provider (PT): Vladimir Crofts, MD   Encounter Date: 01/10/2021   PT End of Session - 01/10/21 1125     Visit Number 16    Number of Visits 17    Date for PT Re-Evaluation 01/16/21    Authorization Type LSVT BIG, eval performed 12/12/2020    Authorization Time Period 12/12/2020-01/16/2021    Authorization - Visit Number 2    Authorization - Number of Visits 17    PT Start Time 1016    PT Stop Time 1113    PT Time Calculation (min) 57 min    Equipment Utilized During Treatment Gait belt    Activity Tolerance Patient tolerated treatment well    Behavior During Therapy [facility] for tasks assessed/performed             History reviewed. No pertinent past medical history.  History reviewed. No pertinent surgical history.  There were no vitals filed for this visit.  Interventions   Subjective Assessment - 01/10/21 1124     Subjective Patient reports no major changes since prior session.  Patient reports he was not able to do his home exercises last night.    Pertinent History Pt is a pleasant 70 y/o male presenting to PT for LSVT BIG evaluation. Pt reports onset of Parkinson's sx occured over 3 years ago with noticing initial changes in gait and tremor of L hand. Pt reports gradual worsening of gait with shuffling, festination. Denies freezing episodes. Reports one fall over six months ago d/t gait festination. Pt also reports feelings of "stiffness" impacting his ability to perform transfers. He reports FOF with ambulating and ascending/descending steps. Pt does not use an AD, but does express that gait is his greatest concern, where he is unable to ambulate for long periods of time and has difficulty with  ambulating over uneven, compliant surfaces and up/down hills. Per chart other PMH is significant for: chronic lacunar infarct within L lentiform nucleus, sleep apnea (uses CPAP), Dupuytren Contracture LUE 5th digit, mild visual hallucinations, COVID (2020), uses hearing aids, blockage of coronary artery of heart, h/o cardiac cath (2008), HLD, HTN.    Limitations Walking;Writing;Standing;Sitting;House hold activities    How long can you sit comfortably? Pt reports >1 hr if chair is padded; pt reports if hard chair he is limited to 30 min d/t discomfort from numbness in LEs    How long can you stand comfortably? Pt reports if he is fatigued he is only able to stand 30-45 min.    How long can you walk comfortably? 50 yards    Diagnostic tests imging 09/05/2020 MR brain WO contrast: "IMPRESSION:  No evidence of acute intracranial abnormality.  Moderate generalized cerebral atrophy and cerebral white matter  chronic small vessel ischemic disease, progressed from the head CT  of 11/09/2009.  Small chronic lacunar infarct within the left lentiform nucleus.  Comparatively mild cerebellar atrophy.  Minimal right maxillary sinus mucosal thickening."    Patient Stated Goals Would like to walk without shuffling    Currently in Pain? No/denies             INTERVENTIONS - retesting of goals for final visist  FOTO: 62 (achieved, same as prior assessment)  DGI: 23/24 (achieved, previously 18/24)  10MWT-  1.4 m/s (achieved and improved, previously achieved at 1.25 m/s)  SLB- 10-11 BLEs (achieved, previously 4 seconds B)  Follow-up Questions:   Now that you have completed this treatment have you noticed changes in movement, function or mobility? Pt reports he is more aware of how he is moving. Pt reports he is now trying to take bigger steps and swing his arms more. He reports overall improved self-monitoring. Pt reports he is walking longer distances.   Have other people commented that you are moving  better now? Yes? No? What have they said (if they've said anything)? Pt reports wife says she sees improvement with pt's movement.  Do people offer to help you as much as they used to with movement or mobility related tasks? Pt reports he is asking his wife to help him less now.   When you want to move as well as possible, what do you do? --how often do you do that? Pt reports he tries to be aware of what he is doing and how he is moving more often. He states he "resets" how he is moving.    Do you move more or are you more active now that you've completed LSVT BIG? Pt reports he has more events to go to so his activity has increased. Otherwise he is unsure.  Has you balance improved? Pt reports he feels his balance is better.   Are you able to walk faster since completing treatment? Please describe if yes. Pt states, "when I want to."   Are there other tasks you are able to complete in less time since completing treatment (dressing, getting in or out of a vehicle?). If yes please describe: Pt reports improved ability to walk across parking lot to see his son's soccer games.   How many falls have you had I the last month? 0  Has your stamina improved? Please describe? Pt reports stamina has improved.   Are there things you can do now without help that you needed help with prior to treatment? If so what are those things? Pt reports he is unsure    LSVT BIG Intervention Review (performed as many exercises as time permitted):   Patient seen for LSVT Daily Session Maximal Daily Exercises for facilitation/coordination of movement Sustained movements are designed to rescale the amplitude of movement output for generalization to daily functional activities.   Maximal Daily Exercises:  Floor to Ceiling 5 with 10 second hold;   Side to Side 5 Bilateral with 10 second hold Forward Step and Reach 10 Bilateral  Sideways Step and Reach 10 Bilateral   Backwards Step and Reach 10 Bilateral  <10%  cuing Forwards Rock and Reach 20 Bilateral Sideways Rock and Reach 10 Bilateral   Maximal Daily Exercise Comments: Pt reviews maximal daily exercises with pt as time permits to improve pt's independence with program to maintain gains beyond PT. Pt requires minimal cuing overall and is independent with majority of exercises.   Functional Component Tasks - performance and progress: 5 reps for each;  1.Sit to Stand x 5 from standard chair  2.Kneeling to stand with UE support  3. Big step over object   4. Walk and Turn (around object) 5. Walk on compliant surface  Comments: Reviewed with pt. Pt is independent with all functional component tasks. Emphasized importance of using STS technique for transfers.   Hierarchy Tasks - performance and progress:  Big reach to LE with big lift of LE into seated FABER position, removes shoes and  socks big and puts them back on big while maintaining position for at least 5 seconds: Pt independent with task.  Progression to ambulating for long walk outdoors. Due to inclement weather performed indoors. Pt ambulates 3x148 ft around clinic gym, focus on maintaining technique and performing big turns.   Gait -Incorporated into hierarchy task (see above). Pt does not require rest breaks this session.    Carryover Assignment: Instructed pt to maintain BIG movement with all activities/walking. Instructed pt to refer to prior assignments as well for practice beyond therapy.    homework: Emphasized importance of continuing with LSVT homework/exercises after d/c from therapy to maintain gains beyond therapy. Pt to perform all 7 LSVT BIG exercises, 5 Functional Component Tasks and big walking and refer to carryover tasks for practice. Pt verbalized understanding.   Pt educated throughout session about proper posture and technique with exercises. Improved exercise technique, movement at target joints, use of target muscles after min to mod verbal, visual, tactile cues.    [facility] PT Assessment - 01/10/21 0001       Dynamic Gait Index   Level Surface Normal    Change in Gait Speed Normal    Gait with Horizontal Head Turns Normal    Gait with Vertical Head Turns Normal    Gait and Pivot Turn Normal    Step Over Obstacle Normal    Step Around Obstacles Normal    Steps Mild Impairment    Total Score 23                PT Education - 01/10/21 1125     Education Details What to do following discharge from PT, resources, HEP, exercise technique and body mechanics    Person(s) Educated Patient    Methods Explanation;Demonstration;Tactile cues;Verbal cues;Handout    Comprehension Verbalized understanding;Returned demonstration;Need further instruction              PT Short Term Goals - 01/10/21 1126       PT SHORT TERM GOAL #1   Title Pt will be independent with LSVT BIG HEP in order to improve functional mobility and balance to decrease fall risk and improve function at home.    Baseline 9/6: initiated 9/21: pt still requires cuing for correct technique with some of his HEP; 9/30: Pt required minor cuing for technique with some of his HEP but was otherwise able to demo and verbalize independence with program.    Time 3    Period Weeks    Status Partially Met    Target Date 01/02/21               PT Long Term Goals - 01/10/21 1127       PT LONG TERM GOAL #1   Title Patient will increase FOTO score to equal to or greater than 61 to demonstrate statistically significant improvement in mobility and quality of life.    Baseline 9/1: 52 9/21: 62; 9/30: 62    Time 5    Period Weeks    Status Achieved      PT LONG TERM GOAL #2   Title Patient will tolerate 5 seconds of single leg stance without loss of balance to improve ability to step over obstacles/up and down curbs safely.    Baseline 9/1: 2-3 sec BLEs; 9/21: pt achieves approx 4 seconds B; 9/30: 10-11 sec B    Time 5    Period Weeks    Status Achieved      PT LONG TERM  GOAL #3    Title Pt will improve DGI by at least 3 points in order to demonstrate clinically significant improvement in balance and decreased risk for falls so pt can safely go on walks outdoors with his wife.    Baseline 9/1: 18/24; 9/21: deferred d/t time; 9/30: 23/24    Time 5    Period Weeks    Status Achieved      PT LONG TERM GOAL #4   Title Patient will increase 10 meter walk test to >1.56ms as to improve gait speed for better community ambulation.    Baseline 9/1: 0.89 m/s; 9/21: 1.25 m/s; 9/30: 1.4 m/s    Time 5    Period Weeks    Status Achieved                   Plan - 01/10/21 1141     Clinical Impression Statement Goals reviewed for final session.  Patient has met all long-term goals, indicating improvements in gait speed and ability, static and dynamic balance, endurance, and overall functional mobility and quality of life.  Patient partially met short-term goal of being independent with all exercises in LSVT BIG program (see goals for details). Patient now exhibits ability to sustain larger amplitude movements to successfully override hypokinesia and bradykinesia with gait and mobility.  PT instructed patient in continuing to perform HEP beyond therapy to continue and maintain gains.  PT also informed patient that he can always follow-up with clinic for a checkup appointment or if he wishes to repeat the program on a future date should he feel he regresses.  Patient verbalized understanding for all.  The patient has completed the LSVT BIG program and is to be discharged from physical therapy at this time.    Personal Factors and Comorbidities Age;Comorbidity 1;Comorbidity 2;Comorbidity 3+;Fitness;Time since onset of injury/illness/exacerbation    Comorbidities chronic lacunar infarct within L lentiform nucleus, hx of COVID19 (2020), HTN, duputyren contracture LUE 5th digit,    Examination-Activity Limitations Hygiene/Grooming;Bend;Stand;Sit;Locomotion  Level;Transfers;Stairs;Dressing;Squat    Examination-Participation Restrictions Community Activity;Shop;Yard Work;Cleaning;Laundry    Stability/Clinical Decision Making Evolving/Moderate complexity    Rehab Potential Good    PT Frequency 4x / week    PT Duration Other (comment)   coverage for 5 weeks to cover the full 4 week program from evaluation onward   PT Treatment/Interventions ADLs/Self Care Home Management;DME Instruction;Gait training;Stair training;Functional mobility training;Therapeutic activities;Balance training;Therapeutic exercise;Neuromuscular re-education;Patient/family education;Orthotic Fit/Training;Manual techniques;Passive range of motion;Energy conservation;Taping;Splinting    PT Next Visit Plan maximal daily exercises, functional component task training, hierarchy task trianing, BIG walking, HEP/daily carryover assignments    PT Home Exercise Plan Reinforced importance of continuing to perform 7 LSVT BIG maximal daiy exercises, 5 functional component tasks, big walking and carryover assignment. Handout/packet provided with additional written instructions. Pt instructed in referring to carryover assignments for further practice.    Consulted and Agree with Plan of Care Patient             Patient will benefit from skilled therapeutic intervention in order to improve the following deficits and impairments:  Abnormal gait, Decreased activity tolerance, Decreased endurance, Decreased range of motion, Decreased knowledge of use of DME, Hypomobility, Improper body mechanics, Impaired UE functional use, Decreased balance, Decreased coordination, Decreased mobility, Difficulty walking, Impaired flexibility, Postural dysfunction  Visit Diagnosis: Other abnormalities of gait and mobility  Other lack of coordination  Parkinson disease ([place]     Problem List There are no problems to display for this patient.  Zollie Pee, PT 01/10/2021, 11:46 AM  [place] MAIN [facility] SERVICES [address] [place], [place], [address] Phone: [phone]   Fax:  [phone]  Name: Kelsey Edman MRN: [id number] Date of Birth: 1950-05-28

## 2021-05-13 DIAGNOSIS — I2510 Atherosclerotic heart disease of native coronary artery without angina pectoris: Secondary | ICD-10-CM | POA: Diagnosis not present

## 2021-05-13 DIAGNOSIS — E669 Obesity, unspecified: Secondary | ICD-10-CM | POA: Diagnosis not present

## 2021-05-13 DIAGNOSIS — I10 Essential (primary) hypertension: Secondary | ICD-10-CM | POA: Diagnosis not present

## 2021-05-13 DIAGNOSIS — G20 Parkinson's disease: Secondary | ICD-10-CM | POA: Diagnosis not present

## 2021-05-13 DIAGNOSIS — Z6839 Body mass index (BMI) 39.0-39.9, adult: Secondary | ICD-10-CM | POA: Diagnosis not present

## 2021-05-13 DIAGNOSIS — Z79899 Other long term (current) drug therapy: Secondary | ICD-10-CM | POA: Diagnosis not present

## 2021-05-13 DIAGNOSIS — E782 Mixed hyperlipidemia: Secondary | ICD-10-CM | POA: Diagnosis not present

## 2021-05-27 DIAGNOSIS — G4733 Obstructive sleep apnea (adult) (pediatric): Secondary | ICD-10-CM | POA: Diagnosis not present

## 2021-05-27 DIAGNOSIS — E782 Mixed hyperlipidemia: Secondary | ICD-10-CM | POA: Diagnosis not present

## 2021-05-27 DIAGNOSIS — Z6839 Body mass index (BMI) 39.0-39.9, adult: Secondary | ICD-10-CM | POA: Diagnosis not present

## 2021-05-27 DIAGNOSIS — I2510 Atherosclerotic heart disease of native coronary artery without angina pectoris: Secondary | ICD-10-CM | POA: Diagnosis not present

## 2021-05-27 DIAGNOSIS — I10 Essential (primary) hypertension: Secondary | ICD-10-CM | POA: Diagnosis not present

## 2021-05-27 DIAGNOSIS — Z23 Encounter for immunization: Secondary | ICD-10-CM | POA: Diagnosis not present

## 2021-08-13 DIAGNOSIS — G4733 Obstructive sleep apnea (adult) (pediatric): Secondary | ICD-10-CM | POA: Diagnosis not present

## 2021-08-13 DIAGNOSIS — E669 Obesity, unspecified: Secondary | ICD-10-CM | POA: Diagnosis not present

## 2021-08-13 DIAGNOSIS — R0981 Nasal congestion: Secondary | ICD-10-CM | POA: Diagnosis not present

## 2021-08-13 DIAGNOSIS — M720 Palmar fascial fibromatosis [Dupuytren]: Secondary | ICD-10-CM | POA: Diagnosis not present

## 2021-08-13 DIAGNOSIS — Z9989 Dependence on other enabling machines and devices: Secondary | ICD-10-CM | POA: Diagnosis not present

## 2021-08-13 DIAGNOSIS — G20 Parkinson's disease: Secondary | ICD-10-CM | POA: Diagnosis not present

## 2021-08-29 DIAGNOSIS — H40013 Open angle with borderline findings, low risk, bilateral: Secondary | ICD-10-CM | POA: Diagnosis not present

## 2021-08-29 DIAGNOSIS — H5213 Myopia, bilateral: Secondary | ICD-10-CM | POA: Diagnosis not present

## 2021-08-29 DIAGNOSIS — Z8669 Personal history of other diseases of the nervous system and sense organs: Secondary | ICD-10-CM | POA: Diagnosis not present

## 2021-08-29 DIAGNOSIS — H43811 Vitreous degeneration, right eye: Secondary | ICD-10-CM | POA: Diagnosis not present

## 2021-09-22 DIAGNOSIS — Z0100 Encounter for examination of eyes and vision without abnormal findings: Secondary | ICD-10-CM | POA: Diagnosis not present

## 2021-09-24 DIAGNOSIS — Z79899 Other long term (current) drug therapy: Secondary | ICD-10-CM | POA: Diagnosis not present

## 2021-09-24 DIAGNOSIS — I10 Essential (primary) hypertension: Secondary | ICD-10-CM | POA: Diagnosis not present

## 2021-09-24 DIAGNOSIS — E782 Mixed hyperlipidemia: Secondary | ICD-10-CM | POA: Diagnosis not present

## 2021-10-01 DIAGNOSIS — I10 Essential (primary) hypertension: Secondary | ICD-10-CM | POA: Diagnosis not present

## 2021-10-01 DIAGNOSIS — Z79899 Other long term (current) drug therapy: Secondary | ICD-10-CM | POA: Diagnosis not present

## 2021-10-01 DIAGNOSIS — E785 Hyperlipidemia, unspecified: Secondary | ICD-10-CM | POA: Diagnosis not present

## 2021-10-01 DIAGNOSIS — G4733 Obstructive sleep apnea (adult) (pediatric): Secondary | ICD-10-CM | POA: Diagnosis not present

## 2021-10-01 DIAGNOSIS — E669 Obesity, unspecified: Secondary | ICD-10-CM | POA: Diagnosis not present

## 2021-10-01 DIAGNOSIS — G20 Parkinson's disease: Secondary | ICD-10-CM | POA: Diagnosis not present

## 2021-10-01 DIAGNOSIS — Z0000 Encounter for general adult medical examination without abnormal findings: Secondary | ICD-10-CM | POA: Diagnosis not present

## 2021-10-01 DIAGNOSIS — I2510 Atherosclerotic heart disease of native coronary artery without angina pectoris: Secondary | ICD-10-CM | POA: Diagnosis not present

## 2021-10-01 DIAGNOSIS — Z125 Encounter for screening for malignant neoplasm of prostate: Secondary | ICD-10-CM | POA: Diagnosis not present

## 2021-10-20 DIAGNOSIS — L03031 Cellulitis of right toe: Secondary | ICD-10-CM | POA: Diagnosis not present

## 2021-10-20 DIAGNOSIS — L03032 Cellulitis of left toe: Secondary | ICD-10-CM | POA: Diagnosis not present

## 2021-11-03 DIAGNOSIS — L03031 Cellulitis of right toe: Secondary | ICD-10-CM | POA: Diagnosis not present

## 2021-11-03 DIAGNOSIS — L03032 Cellulitis of left toe: Secondary | ICD-10-CM | POA: Diagnosis not present

## 2022-01-26 IMAGING — MR MR HEAD W/O CM
11 series · 46 of 48 positions shown · non-contrast
Comparison: Head CT 11/09/2009.

CLINICAL DATA: Ataxia. Additional history provided by scanning
technologist: Patient reports worsening ataxia for 2 years, gradual
changes in gait, stumbling from time to time, involuntarily leaning
forward when walking.

EXAM:
MRI HEAD WITHOUT CONTRAST
TECHNIQUE: Multiplanar, multiecho pulse sequences of the brain and surrounding
structures were obtained without intravenous contrast.

[Series 5: ax dwi_tracew · axial · 3.0mm · 0.65mm/px · z∈[-63,+89]mm · 4 of 48 slices shown]
[im 1/48]
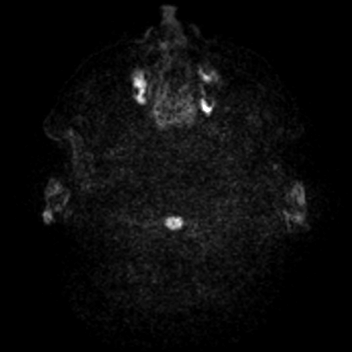
[im 16/48]
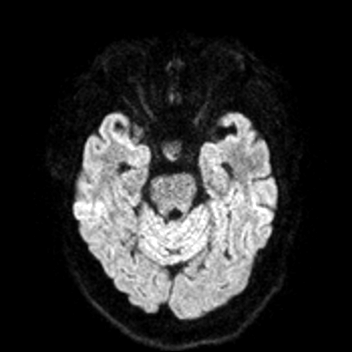
[im 32/48]
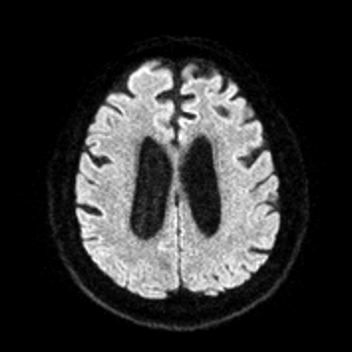
[im 48/48]
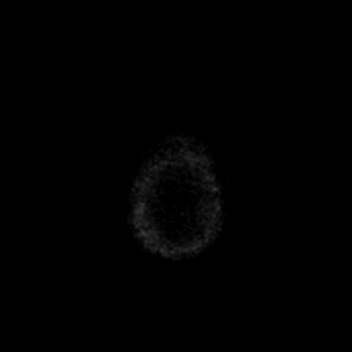

[Series 6: ax dwi_adc · axial · 3.0mm · 0.65mm/px · z∈[-63,+89]mm · 4 of 48 slices shown]
[im 1/48]
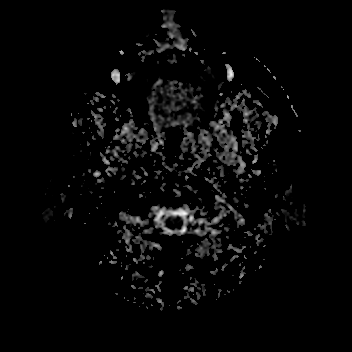
[im 16/48]
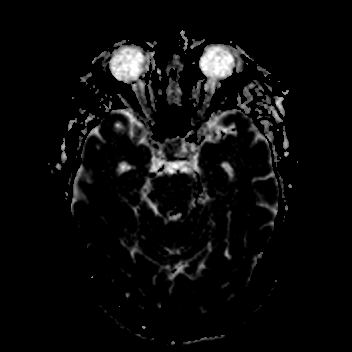
[im 32/48]
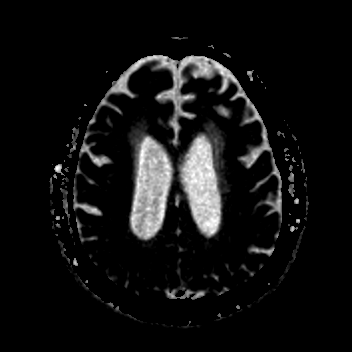
[im 48/48]
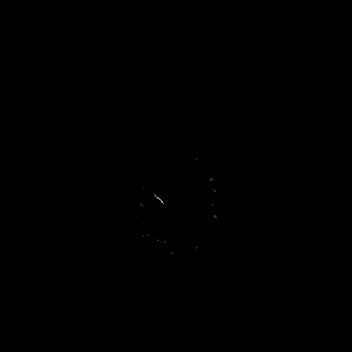

[Series 7: cor dwi_tracew · coronal · 5.0mm · 0.65mm/px · 3 of 40 slices shown]
[im 1/40]
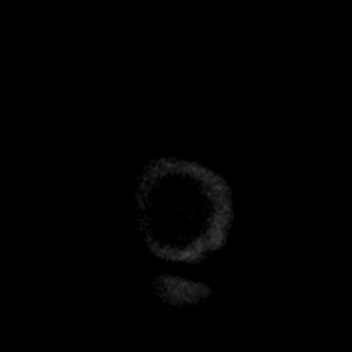
[im 20/40]
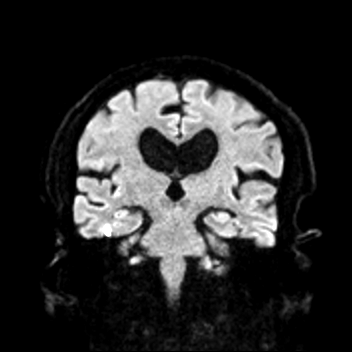
[im 40/40]
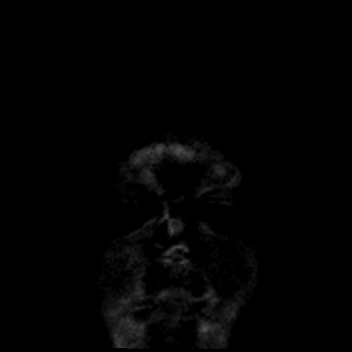

[Series 8: cor dwi_adc · coronal · 5.0mm · 0.65mm/px · 3 of 40 slices shown]
[im 1/40]
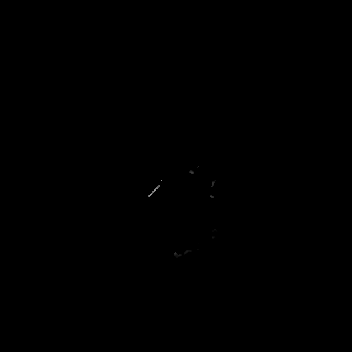
[im 20/40]
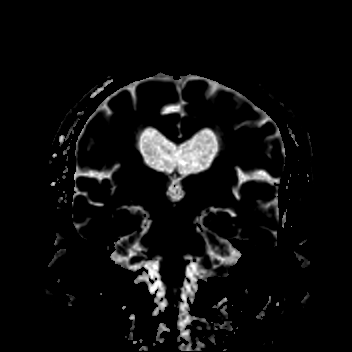
[im 40/40]
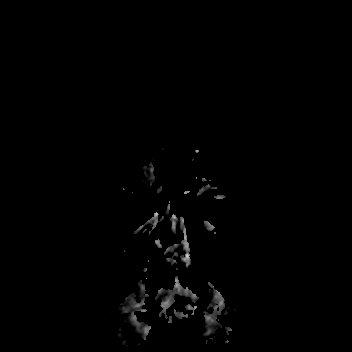

[Series 9: T1 · sagittal · 5.0mm · 0.62mm/px · 2 of 25 slices shown (1 of 2)]
[im 1/25]
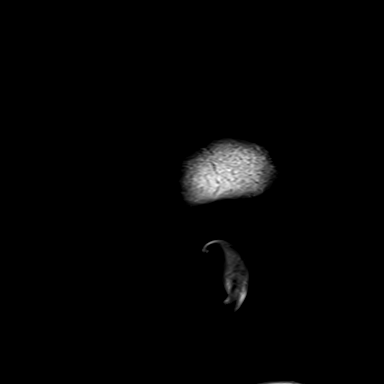
[im 25/25]
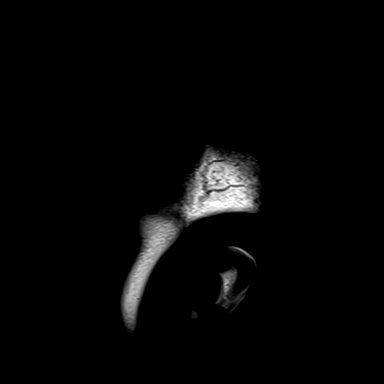

[Series 10: T2 · axial · 5.0mm · 0.53mm/px · z∈[-58,+82]mm · 2 of 25 slices shown (1 of 2)]
[im 1/25]
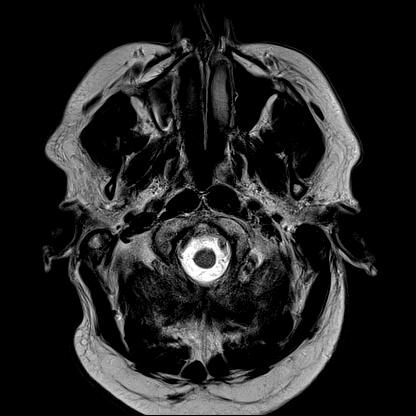
[im 25/25]
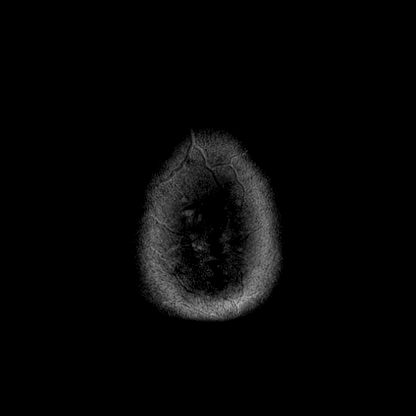

[Series 15: T1 · axial · 1.0mm · 0.98mm/px · z∈[-70,+101]mm · 12 of 176 slices shown (2 of 2)]
[im 1/176]
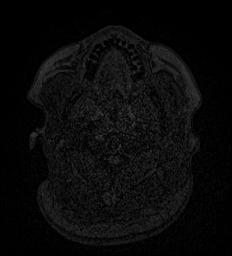
[im 14/176]
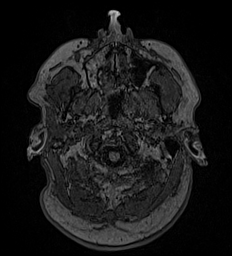
[im 27/176]
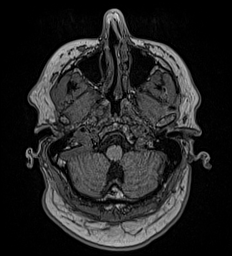
[im 41/176]
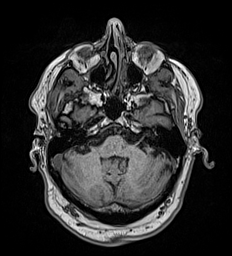
[im 54/176]
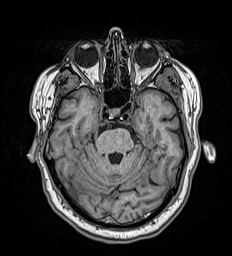
[im 68/176]
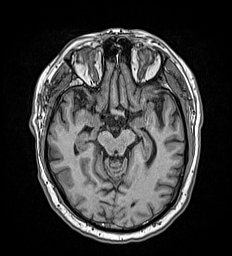
[im 81/176]
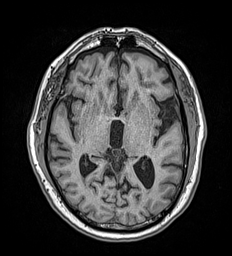
[im 95/176]
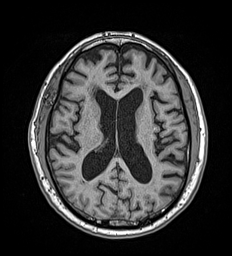
[im 108/176]
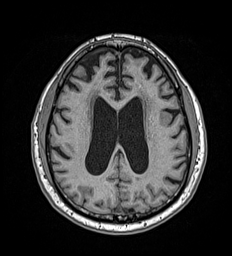
[im 122/176]
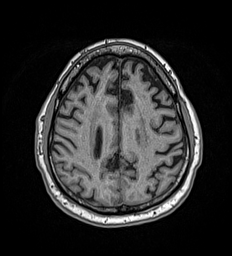
[im 149/176]
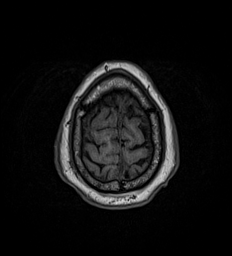
[im 176/176]
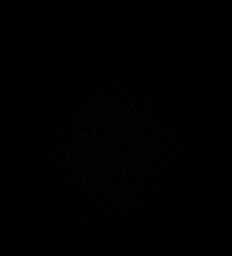

[Series 16: FLAIR · axial · 3.0mm · 0.53mm/px · z∈[-67,+91]mm · 4 of 55 slices shown]
[im 1/55]
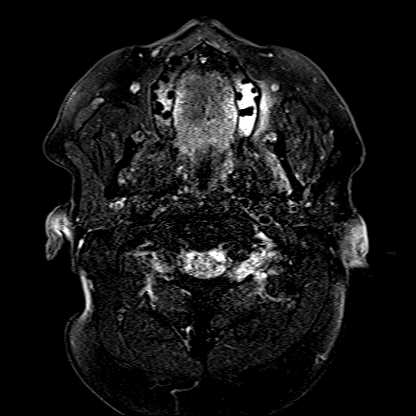
[im 19/55]
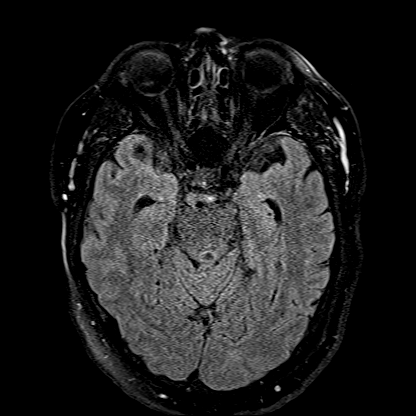
[im 37/55]
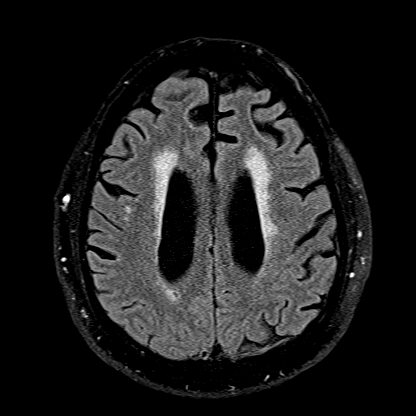
[im 55/55]
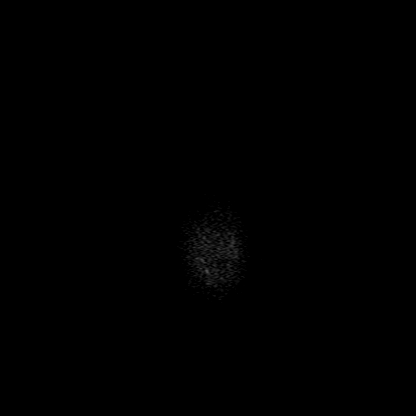

[Series 17: T2 · coronal · 5.0mm · 0.57mm/px · 2 of 29 slices shown (2 of 2)]
[im 1/29]
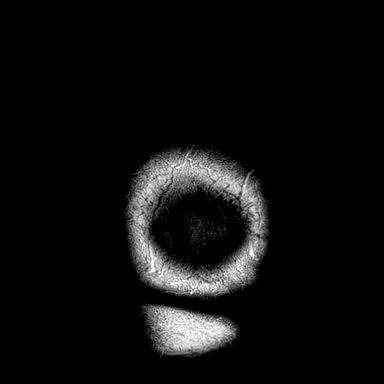
[im 29/29]
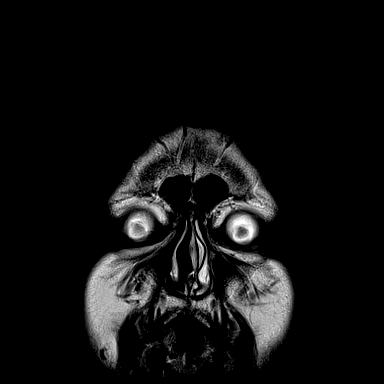

[Series 19: pha_images · axial · 3.0mm · 0.90mm/px · z∈[-74,+96]mm · 5 of 59 slices shown]
[im 1/59]
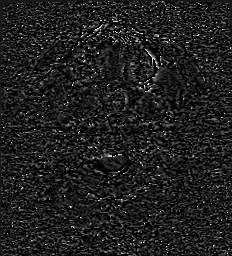
[im 15/59]
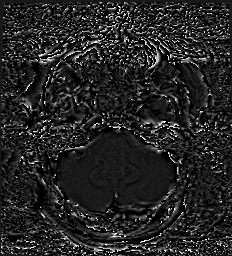
[im 30/59]
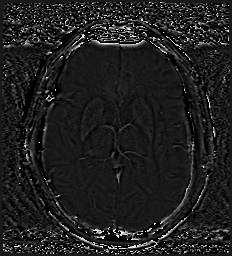
[im 44/59]
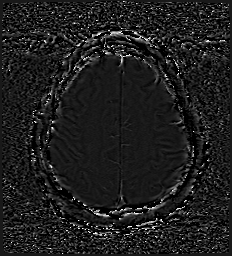
[im 59/59]
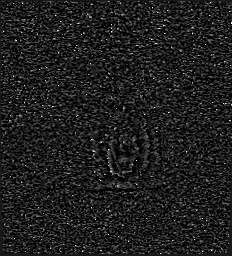

[Series 20: swi_images · axial · 3.0mm · 0.90mm/px · z∈[-74,+99]mm · 5 of 60 slices shown]
[im 1/60]
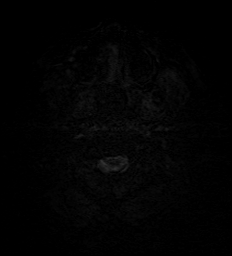
[im 15/60]
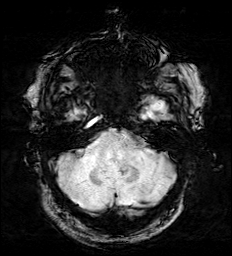
[im 30/60]
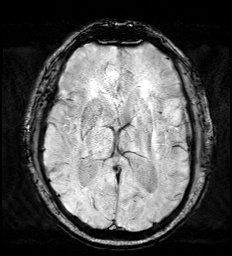
[im 45/60]
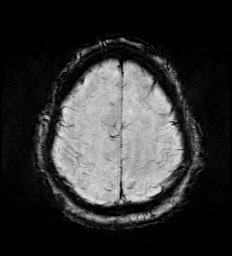
[im 60/60]
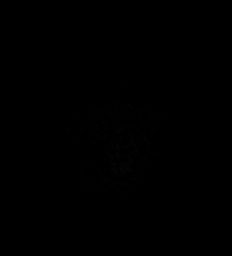

[46 of 48 positions shown; findings below may reference images not displayed]

FINDINGS: Brain:

Mild intermittent motion degradation.

Moderate cerebral atrophy. Commensurate prominence of the ventricles
and sulci. Comparatively mild cerebellar atrophy.

Moderate multifocal T2/FLAIR hyperintensity within the cerebral
white matter, nonspecific but compatible with chronic small vessel
ischemic disease.

Chronic lacunar infarct within the left lentiform nucleus.

There is no acute infarct.

No evidence of intracranial mass.

No chronic intracranial blood products.

No extra-axial fluid collection.

No midline shift.

Vascular: Expected proximal arterial flow voids.

Skull and upper cervical spine: No focal marrow lesion.

Sinuses/Orbits: Visualized orbits show no acute finding. Trace right
maxillary sinus mucosal thickening.
IMPRESSION: No evidence of acute intracranial abnormality.

Moderate generalized cerebral atrophy and cerebral white matter
chronic small vessel ischemic disease, progressed from the head CT
of 11/09/2009.

Small chronic lacunar infarct within the left lentiform nucleus.

Comparatively mild cerebellar atrophy.

Minimal right maxillary sinus mucosal thickening.

## 2022-02-24 DIAGNOSIS — I2510 Atherosclerotic heart disease of native coronary artery without angina pectoris: Secondary | ICD-10-CM | POA: Diagnosis not present

## 2022-02-24 DIAGNOSIS — I10 Essential (primary) hypertension: Secondary | ICD-10-CM | POA: Diagnosis not present

## 2022-02-24 DIAGNOSIS — E782 Mixed hyperlipidemia: Secondary | ICD-10-CM | POA: Diagnosis not present

## 2022-03-19 DIAGNOSIS — G219 Secondary parkinsonism, unspecified: Secondary | ICD-10-CM | POA: Diagnosis not present

## 2022-03-19 DIAGNOSIS — R4189 Other symptoms and signs involving cognitive functions and awareness: Secondary | ICD-10-CM | POA: Diagnosis not present

## 2022-03-19 DIAGNOSIS — R278 Other lack of coordination: Secondary | ICD-10-CM | POA: Diagnosis not present

## 2022-03-19 DIAGNOSIS — R269 Unspecified abnormalities of gait and mobility: Secondary | ICD-10-CM | POA: Diagnosis not present

## 2022-03-19 DIAGNOSIS — G20C Parkinsonism, unspecified: Secondary | ICD-10-CM | POA: Diagnosis not present

## 2022-03-19 DIAGNOSIS — R2689 Other abnormalities of gait and mobility: Secondary | ICD-10-CM | POA: Diagnosis not present

## 2022-03-24 DIAGNOSIS — Z79899 Other long term (current) drug therapy: Secondary | ICD-10-CM | POA: Diagnosis not present

## 2022-03-24 DIAGNOSIS — E782 Mixed hyperlipidemia: Secondary | ICD-10-CM | POA: Diagnosis not present

## 2022-03-24 DIAGNOSIS — Z125 Encounter for screening for malignant neoplasm of prostate: Secondary | ICD-10-CM | POA: Diagnosis not present

## 2022-03-24 DIAGNOSIS — I10 Essential (primary) hypertension: Secondary | ICD-10-CM | POA: Diagnosis not present

## 2022-03-26 ENCOUNTER — Ambulatory Visit: Payer: Medicare Other

## 2022-03-31 ENCOUNTER — Ambulatory Visit: Payer: Medicare HMO | Attending: Neurology | Admitting: Physical Therapy

## 2022-03-31 ENCOUNTER — Encounter: Payer: Self-pay | Admitting: Physical Therapy

## 2022-03-31 ENCOUNTER — Other Ambulatory Visit: Payer: Self-pay

## 2022-03-31 ENCOUNTER — Ambulatory Visit: Payer: Medicare Other | Admitting: Physical Therapy

## 2022-03-31 DIAGNOSIS — I10 Essential (primary) hypertension: Secondary | ICD-10-CM | POA: Diagnosis not present

## 2022-03-31 DIAGNOSIS — M6281 Muscle weakness (generalized): Secondary | ICD-10-CM | POA: Diagnosis not present

## 2022-03-31 DIAGNOSIS — Z79899 Other long term (current) drug therapy: Secondary | ICD-10-CM | POA: Diagnosis not present

## 2022-03-31 DIAGNOSIS — R2689 Other abnormalities of gait and mobility: Secondary | ICD-10-CM | POA: Diagnosis not present

## 2022-03-31 DIAGNOSIS — G20C Parkinsonism, unspecified: Secondary | ICD-10-CM | POA: Diagnosis not present

## 2022-03-31 DIAGNOSIS — R2681 Unsteadiness on feet: Secondary | ICD-10-CM | POA: Diagnosis not present

## 2022-03-31 DIAGNOSIS — E7800 Pure hypercholesterolemia, unspecified: Secondary | ICD-10-CM | POA: Diagnosis not present

## 2022-03-31 DIAGNOSIS — I2510 Atherosclerotic heart disease of native coronary artery without angina pectoris: Secondary | ICD-10-CM | POA: Diagnosis not present

## 2022-03-31 NOTE — Therapy (Signed)
OUTPATIENT PHYSICAL THERAPY NEURO EVALUATION   Patient Name: Kyle Conner MRN: [id number] DOB:June 17, 1950, 71 y.o., male Today's Date: 03/31/2022   PCP: Tinnie Gens D. Judithann Sheen, MD REFERRING PROVIDER: Verlin Grills, MD   END OF SESSION:  PT End of Session - 03/31/22 1228     Visit Number 1    Number of Visits 12    Date for PT Re-Evaluation 05/08/22    Authorization Type [employer]-auth requested at eval    Progress Note Due on Visit 10    PT Start Time 1232    PT Stop Time 1317    PT Time Calculation (min) 45 min    Equipment Utilized During Treatment Gait belt    Activity Tolerance Patient tolerated treatment well    Behavior During Therapy [facility] for tasks assessed/performed             History reviewed. No pertinent past medical history. History reviewed. No pertinent surgical history. There are no problems to display for this patient.   ONSET DATE: 03/19/2022 (MD referral/visit)  REFERRING DIAG:  R26.89 (ICD-10-CM) - Other abnormalities of gait and mobility  R26.9 (ICD-10-CM) - Unspecified abnormalities of gait and mobility  R27.8 (ICD-10-CM) - Other lack of coordination    THERAPY DIAG:  Other abnormalities of gait and mobility  Unsteadiness on feet  Muscle weakness (generalized)  Rationale for Evaluation and Treatment: Rehabilitation  SUBJECTIVE:                                                                                                                                                                                             SUBJECTIVE STATEMENT: Saw Dr. Seward Meth recently and she made some changes with the Carbidopa-levodopa (trying to wean down).  Have gotten to a point where I can't walk very far, tired very quickly.  When that happens, I lean forward, pick up speed and can fall.  Shuffle more with fatigue.  Had seen therapy for LSVT BIG in in 2022, but is not currently doing those exercises at home. 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 No  PRECAUTIONS: Fall  WEIGHT BEARING RESTRICTIONS: No  FALLS: Has patient fallen in last 6 months? Yes. Number of falls 1  LIVING ENVIRONMENT: Lives with: lives with their family Lives in: House/apartment Stairs: Yes: Internal: 12 steps; on right going up and sleeps downstairs and External: 3 steps; on right going up Has following equipment at home: None  PLOF: Independent  PATIENT GOALS: To walk more  OBJECTIVE:   Vitals:  O2  sats 96%, HR 66 bpm (audibly SOB upon sit<>stand and short distance gait)  DIAGNOSTIC FINDINGS: MRI shows CVA in the past (small lacunar infarct)  COGNITION: Overall cognitive status: Within functional limits for tasks assessed   SENSATION: Light touch: WFL  COORDINATION: Reports L side is slower to move at times  MUSCLE TONE: No significant changes with P/ROM BLEs  POSTURE: rounded shoulders and forward head  LOWER EXTREMITY ROM:   WFL BLEs  Active  Right Eval Left Eval  Hip flexion    Hip extension    Hip abduction    Hip adduction    Hip internal rotation    Hip external rotation    Knee flexion    Knee extension    Ankle dorsiflexion    Ankle plantarflexion    Ankle inversion    Ankle eversion     (Blank rows = not tested)  LOWER EXTREMITY MMT:    MMT Right Eval Left Eval  Hip flexion 5 5  Hip extension    Hip abduction    Hip adduction    Hip internal rotation    Hip external rotation    Knee flexion 5 5  Knee extension 5 5  Ankle dorsiflexion 4+ 4  Ankle plantarflexion    Ankle inversion    Ankle eversion    (Blank rows = not tested)  TRANSFERS: Assistive device utilized: None  Sit to stand: Modified independence Stand to sit: Modified independence   GAIT: Gait pattern: step through pattern, decreased step length- Right, decreased step length- Left, decreased ankle  dorsiflexion- Right, decreased ankle dorsiflexion- Left, shuffling, wide BOS, poor foot clearance- Right, and poor foot clearance- Left Distance walked: 60 ft Assistive device utilized: None Level of assistance: Modified independence and SBA Comments: Able to briefly improve foot clearance/heelstrike with cues  FUNCTIONAL TESTS:  5 times sit to stand: 13.81 sec no UE support Timed up and go (TUG): 14.6 sec 10 meter walk test: 11.59 sec (2.83 ft/sec) MiniBESTest :  23/28 TUG cognitive:  14.37 sec TUG Manual:  13.87 sec   TODAY'S TREATMENT:                                                                                                                              DATE: 03/31/2022    PATIENT EDUCATION: Education details: Eval results, POC; answered pt's initial questions about walking poles and provided cues for posture upon sit<>stand and with gait for improved foot clearance Person educated: Patient Education method: Explanation Education comprehension: verbalized understanding  HOME EXERCISE PROGRAM: Not yet initiated  GOALS: Goals reviewed with patient? Yes  SHORT TERM GOALS: Target date: 04/24/2022  Pt will be independent with HEP for improved balance, gait, posture, strength. Baseline: Goal status: INITIAL  2.  Pt will improve 5x sit<>stand to less than or equal to 12.5 sec to demonstrate improved functional strength and transfer efficiency.  Baseline: 13.81 sec Goal status: INITIAL  LONG TERM GOALS: Target  date: 05/08/2022  Pt will be independent with progression of HEP for improved balance, gait, posture, strength. Baseline:  Goal status: INITIAL  2.  Pt will improve TUG score to less than or equal to 13.5 sec for decreased fall risk. Baseline:  Goal status: INITIAL  3.  Pt will verbalize tips to decrease hastening, shuffling with gait. Baseline:  Goal status: INITIAL  4.  6MWT to be assessed and goal to be written as appropriate. Baseline:  Goal status:  INITIAL  ASSESSMENT:  CLINICAL IMPRESSION: Patient is a 71 y.o. male who was seen today for physical therapy evaluation and treatment for imbalance, other abnormalities of gait.   Pt has seen Dr. Mervyn Skeeters at [place] recently, with possible dx of vascular Parkinsonism.  Pt reports episodes of forward posture, hastening of gait, where he almost falls; these episodes occur most often with fatigue.  He has had at least 1 fall in the past 6 months.  He does present with forward flexed posture, decreased functional strength, decreased balance, decreased timing and coordination of gait.  He would benefit from skilled PT to address the above stated deficits to decrease fall risk and improve overall functional mobility.  OBJECTIVE IMPAIRMENTS: Abnormal gait, decreased balance, decreased mobility, difficulty walking, decreased strength, and postural dysfunction.   ACTIVITY LIMITATIONS: standing, stairs, transfers, and locomotion level  PARTICIPATION LIMITATIONS: shopping, community activity, and family activities  PERSONAL FACTORS: 3+ comorbidities: see PMH  are also affecting patient's functional outcome.   REHAB POTENTIAL: Good  CLINICAL DECISION MAKING: Evolving/moderate complexity  EVALUATION COMPLEXITY: Moderate  PLAN:  PT FREQUENCY: 2x/week  PT DURATION: 6 weeks (may not meet frequency due to pt out of town over the holidays)  PLANNED INTERVENTIONS: Therapeutic exercises, Therapeutic activity, Neuromuscular re-education, Balance training, Gait training, Patient/Family education, and Self Care  PLAN FOR NEXT SESSION: Assess 6MWT; initiate HEP for standing PWR! Moves; gait activities for improved foot clearance, SLS; resisted gait   Tavi Hoogendoorn W., PT 03/31/2022, 3:39 PM [facility] at [facility] [place], [place], [place] [address] Phone # [phone] Fax # [phone]

## 2022-04-16 ENCOUNTER — Encounter: Payer: Self-pay | Admitting: Physical Therapy

## 2022-04-16 ENCOUNTER — Ambulatory Visit: Payer: Medicare HMO | Attending: Neurology | Admitting: Physical Therapy

## 2022-04-16 DIAGNOSIS — R2689 Other abnormalities of gait and mobility: Secondary | ICD-10-CM | POA: Insufficient documentation

## 2022-04-16 DIAGNOSIS — M6281 Muscle weakness (generalized): Secondary | ICD-10-CM | POA: Insufficient documentation

## 2022-04-16 DIAGNOSIS — R2681 Unsteadiness on feet: Secondary | ICD-10-CM | POA: Diagnosis not present

## 2022-04-16 NOTE — Therapy (Signed)
OUTPATIENT PHYSICAL THERAPY NEURO TREATMENT NOTE   Patient Name: Kyle Conner MRN: [id number] DOB:[birth year], 72 y.o., male Today's Date: 04/16/2022   PCP: Dellis Filbert D. Doy Hutching, MD REFERRING PROVIDER: Tally Due, MD   END OF SESSION:  PT End of Session - 04/16/22 1024     Visit Number 2    Number of Visits 12    Date for PT Re-Evaluation 05/08/22    Authorization Type [employer]    Authorization Time Period 03/31/2022-05/08/2022    Authorization - Visit Number 2    Authorization - Number of Visits 12    Progress Note Due on Visit 10    PT Start Time 1025   pt arrives late   PT Stop Time 1057    PT Time Calculation (min) 32 min    Equipment Utilized During Treatment Gait belt    Activity Tolerance Patient tolerated treatment well    Behavior During Therapy WFL for tasks assessed/performed             History reviewed. No pertinent past medical history. History reviewed. No pertinent surgical history. There are no problems to display for this patient.   ONSET DATE: 03/19/2022 (MD referral/visit)  REFERRING DIAG:  R26.89 (ICD-10-CM) - Other abnormalities of gait and mobility  R26.9 (ICD-10-CM) - Unspecified abnormalities of gait and mobility  R27.8 (ICD-10-CM) - Other lack of coordination    THERAPY DIAG:  Other abnormalities of gait and mobility  Unsteadiness on feet  Muscle weakness (generalized)  Rationale for Evaluation and Treatment: Rehabilitation  SUBJECTIVE:                                                                                                                                                                                             SUBJECTIVE STATEMENT: No falls, no problems over the holiday.   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 No  PRECAUTIONS: Fall  WEIGHT BEARING RESTRICTIONS: No  FALLS: Has  patient fallen in last 6 months? Yes. Number of falls 1  LIVING ENVIRONMENT: Lives with: lives with their family Lives in: House/apartment Stairs: Yes: Internal: 12 steps; on right going up and sleeps downstairs and External: 3 steps; on right going up Has following equipment at home: None  PLOF: Independent  PATIENT GOALS: To walk more  OBJECTIVE:    TODAY'S TREATMENT: 04/16/2022 Activity Comments  Vitals:  175/100  Initially tested prior to 6MWT, did not proceed with walking due to BP measures.  Pt reports asymptomatic  Seated PWR! Moves x 10 reps  See below  Short distance gait with cues for upright posture, heelstrike and to look ahead for target, 40 ft x 4 With conversation, he hastens with gait and has decreased foot clearance  Vitals at end of session: 169/94          Pt performs PWR! Moves in seated position x 10 reps   PWR! Up for improved posture  PWR! Rock for improved weighshifting  PWR! Twist for improved trunk rotation   PWR! Step for improved step initiation   Cues provided for technique and large motions.  Explained rationale for seated PWR! Moves in relation to function  PATIENT EDUCATION: Education details: PWR! Moves sitting added to HEP-see instructions Person educated: Patient Education method: Explanation, Demonstration, Verbal cues, and Handouts Education comprehension: verbalized understanding and returned demonstration  ---------------------------------------------------------------------------------------------------- Measures below taken at initial evaluation:  Vitals:  O2 sats 96%, HR 66 bpm (audibly SOB upon sit<>stand and short distance gait)  DIAGNOSTIC FINDINGS: MRI shows CVA in the past (small lacunar infarct)  COGNITION: Overall cognitive status: Within functional limits for tasks assessed   SENSATION: Light touch: WFL  COORDINATION: Reports L side is slower to move at times  MUSCLE TONE: No significant changes with P/ROM  BLEs  POSTURE: rounded shoulders and forward head  LOWER EXTREMITY ROM:   WFL BLEs  Active  Right Eval Left Eval  Hip flexion    Hip extension    Hip abduction    Hip adduction    Hip internal rotation    Hip external rotation    Knee flexion    Knee extension    Ankle dorsiflexion    Ankle plantarflexion    Ankle inversion    Ankle eversion     (Blank rows = not tested)  LOWER EXTREMITY MMT:    MMT Right Eval Left Eval  Hip flexion 5 5  Hip extension    Hip abduction    Hip adduction    Hip internal rotation    Hip external rotation    Knee flexion 5 5  Knee extension 5 5  Ankle dorsiflexion 4+ 4  Ankle plantarflexion    Ankle inversion    Ankle eversion    (Blank rows = not tested)  TRANSFERS: Assistive device utilized: None  Sit to stand: Modified independence Stand to sit: Modified independence   GAIT: Gait pattern: step through pattern, decreased step length- Right, decreased step length- Left, decreased ankle dorsiflexion- Right, decreased ankle dorsiflexion- Left, shuffling, wide BOS, poor foot clearance- Right, and poor foot clearance- Left Distance walked: 60 ft Assistive device utilized: None Level of assistance: Modified independence and SBA Comments: Able to briefly improve foot clearance/heelstrike with cues  FUNCTIONAL TESTS:  5 times sit to stand: 13.81 sec no UE support Timed up and go (TUG): 14.6 sec 10 meter walk test: 11.59 sec (2.83 ft/sec) MiniBESTest :  23/28 TUG cognitive:  14.37 sec TUG Manual:  13.87 sec   TODAY'S TREATMENT:  DATE: 03/31/2022    PATIENT EDUCATION: Education details: Eval results, POC; answered pt's initial questions about walking poles and provided cues for posture upon sit<>stand and with gait for improved foot clearance Person educated: Patient Education method:  Explanation Education comprehension: verbalized understanding  HOME EXERCISE PROGRAM: Not yet initiated  GOALS: Goals reviewed with patient? Yes  SHORT TERM GOALS: Target date: 04/24/2022  Pt will be independent with HEP for improved balance, gait, posture, strength. Baseline: Goal status: INITIAL  2.  Pt will improve 5x sit<>stand to less than or equal to 12.5 sec to demonstrate improved functional strength and transfer efficiency.  Baseline: 13.81 sec Goal status: INITIAL  LONG TERM GOALS: Target date: 05/08/2022  Pt will be independent with progression of HEP for improved balance, gait, posture, strength. Baseline:  Goal status: INITIAL  2.  Pt will improve TUG score to less than or equal to 13.5 sec for decreased fall risk. Baseline:  Goal status: INITIAL  3.  Pt will verbalize tips to decrease hastening, shuffling with gait. Baseline:  Goal status: INITIAL  4.  6MWT to be assessed and goal to be written as appropriate. Baseline:  Goal status: INITIAL  ASSESSMENT:  CLINICAL IMPRESSION: Skilled PT session today focused on initiation of seated PWR! Moves and training for posture and step length with gait.  Attempted to begin 6 MWT, but pt's BP measures indicate not increased activity.  Pt responds well to instruction and performance of seated PWR! Moves.  With light gait activities, pt initially is able to attend to more upright posture and increased step length, but with conversation tasks, reverts back to more shuffling pattern.  He will continue to benefit from skilled PT towards goals for improved functional mobility and balance.    OBJECTIVE IMPAIRMENTS: Abnormal gait, decreased balance, decreased mobility, difficulty walking, decreased strength, and postural dysfunction.   ACTIVITY LIMITATIONS: standing, stairs, transfers, and locomotion level  PARTICIPATION LIMITATIONS: shopping, community activity, and family activities  PERSONAL FACTORS: 3+ comorbidities: see  PMH  are also affecting patient's functional outcome.   REHAB POTENTIAL: Good  CLINICAL DECISION MAKING: Evolving/moderate complexity  EVALUATION COMPLEXITY: Moderate  PLAN:  PT FREQUENCY: 2x/week  PT DURATION: 6 weeks (may not meet frequency due to pt out of town over the holidays)  PLANNED INTERVENTIONS: Therapeutic exercises, Therapeutic activity, Neuromuscular re-education, Balance training, Gait training, Patient/Family education, and Self Care  PLAN FOR NEXT SESSION: Assess 6MWT; initiate HEP for standing PWR! Moves; gait activities for improved foot clearance, SLS; resisted gait   Dustie Brittle W., PT 04/16/2022, 12:51 PM [facility] at [facility] [place], [place], [address] [address] Phone # [phone] Fax # [phone]

## 2022-04-21 ENCOUNTER — Encounter: Payer: Self-pay | Admitting: Physical Therapy

## 2022-04-21 ENCOUNTER — Ambulatory Visit: Payer: Medicare HMO | Admitting: Physical Therapy

## 2022-04-21 DIAGNOSIS — R2681 Unsteadiness on feet: Secondary | ICD-10-CM | POA: Diagnosis not present

## 2022-04-21 DIAGNOSIS — M6281 Muscle weakness (generalized): Secondary | ICD-10-CM

## 2022-04-21 DIAGNOSIS — R2689 Other abnormalities of gait and mobility: Secondary | ICD-10-CM

## 2022-04-21 NOTE — Therapy (Signed)
OUTPATIENT PHYSICAL THERAPY NEURO TREATMENT NOTE   Patient Name: Kyle Conner MRN: [id number] DOB:1951/02/11, 72 y.o., male Today's Date: 04/21/2022   PCP: Tinnie Gens D. Judithann Sheen, MD REFERRING PROVIDER: Verlin Grills, MD   END OF SESSION:  PT End of Session - 04/21/22 1044     Visit Number 3    Number of Visits 12    Date for PT Re-Evaluation 05/08/22    Authorization Type [employer]    Authorization Time Period 03/31/2022-05/08/2022    Authorization - Visit Number 3    Authorization - Number of Visits 12    Progress Note Due on Visit 10    PT Start Time 1050    PT Stop Time 1136    PT Time Calculation (min) 46 min    Equipment Utilized During Treatment Gait belt    Activity Tolerance Patient tolerated treatment well    Behavior During Therapy WFL for tasks assessed/performed             History reviewed. No pertinent past medical history. History reviewed. No pertinent surgical history. There are no problems to display for this patient.   ONSET DATE: 03/19/2022 (MD referral/visit)  REFERRING DIAG:  R26.89 (ICD-10-CM) - Other abnormalities of gait and mobility  R26.9 (ICD-10-CM) - Unspecified abnormalities of gait and mobility  R27.8 (ICD-10-CM) - Other lack of coordination    THERAPY DIAG:  Other abnormalities of gait and mobility  Unsteadiness on feet  Muscle weakness (generalized)  Rationale for Evaluation and Treatment: Rehabilitation  SUBJECTIVE:                                                                                                                                                                                             SUBJECTIVE STATEMENT: No changes.  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 No  PRECAUTIONS: Fall  WEIGHT BEARING RESTRICTIONS: No  FALLS: Has patient fallen in last 6  months? Yes. Number of falls 1  LIVING ENVIRONMENT: Lives with: lives with their family Lives in: House/apartment Stairs: Yes: Internal: 12 steps; on right going up and sleeps downstairs and External: 3 steps; on right going up Has following equipment at home: None  PLOF: Independent  PATIENT GOALS: To walk more  OBJECTIVE:    TODAY'S TREATMENT: 04/21/2022 Activity Comments  Vitals:  168/93, HR 68 Pre  :  678 ft, hastening of gait after 1st minute.  Pt demo forward posture, decreased heelstrike, decreased dorsiflexion, decreased step length Needs 4 standing rest breaks  to reset posture  Reviewed seated PWR! Moves from last visit Pt return demo understanding with min cues  Standing gastroc stretc, 3 x 15 sec Good form with cues  Standing forward step and weightshift to focus on heelstrike, 8 reps each side   Short distance gait along counter 15 ft, x 4 reps with cues for heelstrike, step length, look ahead to target Good slowed pace, no hastening in short distances; continues to look down towards ground   Pt performs PWR! Moves in standing position x 5-10 reps   PWR! Up for improved posture  PWR! Rock for improved weighshifting  PWR! Twist for improved trunk rotation   PWR! Step for improved step initiation   Cues provided for technique, intensity. Described rationale for PWR! Moves exercises in relation to functional activities.  Access Code: [email or web address] URL: [email or web address] Date: 04/21/2022 Prepared by: [facility] - Outpatient  Rehab - [facility]  Exercises - Standing Gastroc Stretch at Counter  - 2 x daily - 7 x weekly - 1 sets - 3 reps - 15-30 sec hold *Pt also has pictures of standing and seated PWR! Moves, 1x/day, 10-20 reps  ---------------------------------------------------------------------------------------------------- Measures below taken at initial evaluation:  Vitals:  O2 sats 96%, HR 66 bpm (audibly SOB upon sit<>stand and  short distance gait)  DIAGNOSTIC FINDINGS: MRI shows CVA in the past (small lacunar infarct)  COGNITION: Overall cognitive status: Within functional limits for tasks assessed   SENSATION: Light touch: WFL  COORDINATION: Reports L side is slower to move at times  MUSCLE TONE: No significant changes with P/ROM BLEs  POSTURE: rounded shoulders and forward head  LOWER EXTREMITY ROM:   WFL BLEs  Active  Right Eval Left Eval  Hip flexion    Hip extension    Hip abduction    Hip adduction    Hip internal rotation    Hip external rotation    Knee flexion    Knee extension    Ankle dorsiflexion    Ankle plantarflexion    Ankle inversion    Ankle eversion     (Blank rows = not tested)  LOWER EXTREMITY MMT:    MMT Right Eval Left Eval  Hip flexion 5 5  Hip extension    Hip abduction    Hip adduction    Hip internal rotation    Hip external rotation    Knee flexion 5 5  Knee extension 5 5  Ankle dorsiflexion 4+ 4  Ankle plantarflexion    Ankle inversion    Ankle eversion    (Blank rows = not tested)  TRANSFERS: Assistive device utilized: None  Sit to stand: Modified independence Stand to sit: Modified independence   GAIT: Gait pattern: step through pattern, decreased step length- Right, decreased step length- Left, decreased ankle dorsiflexion- Right, decreased ankle dorsiflexion- Left, shuffling, wide BOS, poor foot clearance- Right, and poor foot clearance- Left Distance walked: 60 ft Assistive device utilized: None Level of assistance: Modified independence and SBA Comments: Able to briefly improve foot clearance/heelstrike with cues  FUNCTIONAL TESTS:  5 times sit to stand: 13.81 sec no UE support Timed up and go (TUG): 14.6 sec 10 meter walk test: 11.59 sec (2.83 ft/sec) MiniBESTest :  23/28 TUG cognitive:  14.37 sec TUG Manual:  13.87 sec   TODAY'S TREATMENT:  DATE: 03/31/2022    PATIENT EDUCATION: Education details: Eval results, POC; answered pt's initial questions about walking poles and provided cues for posture upon sit<>stand and with gait for improved foot clearance Person educated: Patient Education method: Explanation Education comprehension: verbalized understanding  HOME EXERCISE PROGRAM: Not yet initiated  GOALS: Goals reviewed with patient? Yes  SHORT TERM GOALS: Target date: 04/24/2022  Pt will be independent with HEP for improved balance, gait, posture, strength. Baseline: Goal status: INITIAL  2.  Pt will improve 5x sit<>stand to less than or equal to 12.5 sec to demonstrate improved functional strength and transfer efficiency.  Baseline: 13.81 sec Goal status: INITIAL  LONG TERM GOALS: Target date: 05/08/2022  Pt will be independent with progression of HEP for improved balance, gait, posture, strength. Baseline:  Goal status: INITIAL  2.  Pt will improve TUG score to less than or equal to 13.5 sec for decreased fall risk. Baseline:  Goal status: INITIAL  3.  Pt will verbalize tips to decrease hastening, shuffling with gait. Baseline:  Goal status: INITIAL  4.  6MWT to improve to at least 715 ft, with <2 episodes to stop and reset for hastening of gait. Baseline: 678 ft with 4 episodes of brief stops Goal status: INITIAL  ASSESSMENT:  CLINICAL IMPRESSION: Assessed 6MWT today, with pt ambulating 678 ft in 6 minutes; this is significantly below age related normal values, and he does demonstrate hastening of gait after about 1:30.  He is able to utilize strategy to stop and reset posture, and does this 4 times in the last several minutes.  He does report fatigue in calves at end of walk (noted decreased heelstrike and decreased foot clearance throughout).  Utilized PWR! Moves in standing as well as sitting today, to reinforce posture and timing.  He will continue to benefit from  skilled PT towards goals for improved functional mobility and balance.    OBJECTIVE IMPAIRMENTS: Abnormal gait, decreased balance, decreased mobility, difficulty walking, decreased strength, and postural dysfunction.   ACTIVITY LIMITATIONS: standing, stairs, transfers, and locomotion level  PARTICIPATION LIMITATIONS: shopping, community activity, and family activities  PERSONAL FACTORS: 3+ comorbidities: see PMH  are also affecting patient's functional outcome.   REHAB POTENTIAL: Good  CLINICAL DECISION MAKING: Evolving/moderate complexity  EVALUATION COMPLEXITY: Moderate  PLAN:  PT FREQUENCY: 2x/week  PT DURATION: 6 weeks (may not meet frequency due to pt out of town over the holidays)  PLANNED INTERVENTIONS: Therapeutic exercises, Therapeutic activity, Neuromuscular re-education, Balance training, Gait training, Patient/Family education, and Self Care  PLAN FOR NEXT SESSION: Review HEP for standing PWR! Moves and review gastroc stretc; gait activities for improved foot clearance, SLS; resisted standing and gait   Olita Takeshita W., PT 04/21/2022, 11:40 AM [facility] at [facility] [place], [address], [place] [address] Phone # [phone] Fax # [phone]

## 2022-04-24 ENCOUNTER — Ambulatory Visit: Payer: Medicare HMO | Admitting: Physical Therapy

## 2022-04-24 DIAGNOSIS — R2681 Unsteadiness on feet: Secondary | ICD-10-CM

## 2022-04-24 DIAGNOSIS — R2689 Other abnormalities of gait and mobility: Secondary | ICD-10-CM

## 2022-04-24 DIAGNOSIS — M6281 Muscle weakness (generalized): Secondary | ICD-10-CM

## 2022-04-24 NOTE — Therapy (Signed)
OUTPATIENT PHYSICAL THERAPY NEURO TREATMENT NOTE   Patient Name: Kyle Conner MRN: [id number] DOB:Feb 17, 1951, 72 y.o., male Today's Date: 04/24/2022   PCP: Duane Lope. Judithann Sheen, MD REFERRING PROVIDER: Verlin Grills, MD   END OF SESSION:  PT End of Session - 04/24/22 1021     Visit Number 4    Number of Visits 12    Date for PT Re-Evaluation 05/08/22    Authorization Type [employer]    Authorization Time Period 03/31/2022-05/08/2022    Authorization - Visit Number 4    Authorization - Number of Visits 12    Progress Note Due on Visit 10    PT Start Time 1019    PT Stop Time 1059    PT Time Calculation (min) 40 min    Equipment Utilized During Treatment --    Activity Tolerance Patient tolerated treatment well    Behavior During Therapy [facility] for tasks assessed/performed             No past medical history on file. No past surgical history on file. There are no problems to display for this patient.   ONSET DATE: 03/19/2022 (MD referral/visit)  REFERRING DIAG:  R26.89 (ICD-10-CM) - Other abnormalities of gait and mobility  R26.9 (ICD-10-CM) - Unspecified abnormalities of gait and mobility  R27.8 (ICD-10-CM) - Other lack of coordination    THERAPY DIAG:  Other abnormalities of gait and mobility  Unsteadiness on feet  Muscle weakness (generalized)  Rationale for Evaluation and Treatment: Rehabilitation  SUBJECTIVE:                                                                                                                                                                                             SUBJECTIVE STATEMENT: Doing well.  No changes.  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 No  PRECAUTIONS: Fall  WEIGHT BEARING RESTRICTIONS: No  FALLS: Has patient fallen in last 6 months? Yes. Number of falls 1  LIVING  ENVIRONMENT: Lives with: lives with their family Lives in: House/apartment Stairs: Yes: Internal: 12 steps; on right going up and sleeps downstairs and External: 3 steps; on right going up Has following equipment at home: None  PLOF: Independent  PATIENT GOALS: To walk more  OBJECTIVE:    TODAY'S TREATMENT: 04/24/2022 Activity Comments  Reviewed standing PWR! Moves exercises added to HEP last visit Return demo understanding, good form  Standing forward step and weightshift to focus on heelstrike, 10 reps each side   Forward<>backstep and weightshift, 10 reps Cues for  initial foot clearance   Tandem gait forward/back 4 reps Light UE support  Resisted gait in parallel bars, then 50 ft  x 4 reps with blue theraband; then band removed with cues for heelstrike, upright posture Able to maintain slower, deliberate pace once band removed for first 50 ft.  Improved foot clearance   Heel/toe raises 2 x 10 Cues not to compensate with hips  Gastroc stretches-review of runner's stretch, then standing foot propped on 4" block, 2 x 15 sec   Forward step tap to 6/12" step, then back into runner's stretch position For SLS and weightshifting  Marching in place, focus on hold in air, 10 reps For SLS   Pt performs PWR! Moves in standing position x 5-10 reps   PWR! Up for improved posture  PWR! Rock for improved weighshifting  PWR! Twist for improved trunk rotation   PWR! Step for improved step initiation   Cues provided for technique, intensity.  PATIENT EDUCATION: Education details: Strategy for more upright posture, heelstrike/foot clearance with gait:  to work as if he has the theraband resistance at his waist Person educated: Patient Education method: Explanation, Demonstration, and Tactile cues Education comprehension: verbalized understanding, returned demonstration, and needs further education  Access Code: [email or web address] URL: [email or web address] Date: 04/21/2022 Prepared  by: [facility]  Exercises - Standing Gastroc Stretch at Counter  - 2 x daily - 7 x weekly - 1 sets - 3 reps - 15-30 sec hold *Pt also has pictures of standing and seated PWR! Moves, 1x/day, 10-20 reps  ---------------------------------------------------------------------------------------------------- Measures below taken at initial evaluation:  Vitals:  O2 sats 96%, HR 66 bpm (audibly SOB upon sit<>stand and short distance gait)  DIAGNOSTIC FINDINGS: MRI shows CVA in the past (small lacunar infarct)  COGNITION: Overall cognitive status: Within functional limits for tasks assessed   SENSATION: Light touch: WFL  COORDINATION: Reports L side is slower to move at times  MUSCLE TONE: No significant changes with P/ROM BLEs  POSTURE: rounded shoulders and forward head  LOWER EXTREMITY ROM:   WFL BLEs  Active  Right Eval Left Eval  Hip flexion    Hip extension    Hip abduction    Hip adduction    Hip internal rotation    Hip external rotation    Knee flexion    Knee extension    Ankle dorsiflexion    Ankle plantarflexion    Ankle inversion    Ankle eversion     (Blank rows = not tested)  LOWER EXTREMITY MMT:    MMT Right Eval Left Eval  Hip flexion 5 5  Hip extension    Hip abduction    Hip adduction    Hip internal rotation    Hip external rotation    Knee flexion 5 5  Knee extension 5 5  Ankle dorsiflexion 4+ 4  Ankle plantarflexion    Ankle inversion    Ankle eversion    (Blank rows = not tested)  TRANSFERS: Assistive device utilized: None  Sit to stand: Modified independence Stand to sit: Modified independence   GAIT: Gait pattern: step through pattern, decreased step length- Right, decreased step length- Left, decreased ankle dorsiflexion- Right, decreased ankle dorsiflexion- Left, shuffling, wide BOS, poor foot clearance- Right, and poor foot clearance- Left Distance walked: 60 ft Assistive device utilized:  None Level of assistance: Modified independence and SBA Comments: Able to briefly improve foot clearance/heelstrike with cues  FUNCTIONAL TESTS:  5 times sit  to stand: 13.81 sec no UE support Timed up and go (TUG): 14.6 sec 10 meter walk test: 11.59 sec (2.83 ft/sec) MiniBESTest :  23/28 TUG cognitive:  14.37 sec TUG Manual:  13.87 sec   TODAY'S TREATMENT:                                                                                                                              DATE: 03/31/2022    PATIENT EDUCATION: Education details: Eval results, POC; answered pt's initial questions about walking poles and provided cues for posture upon sit<>stand and with gait for improved foot clearance Person educated: Patient Education method: Explanation Education comprehension: verbalized understanding  HOME EXERCISE PROGRAM: Not yet initiated  GOALS: Goals reviewed with patient? Yes  SHORT TERM GOALS: Target date: 04/24/2022  Pt will be independent with HEP for improved balance, gait, posture, strength. Baseline: Goal status: IN PROGRESS  2.  Pt will improve 5x sit<>stand to less than or equal to 12.5 sec to demonstrate improved functional strength and transfer efficiency.  Baseline: 13.81 sec Goal status: IN PROGRESS  LONG TERM GOALS: Target date: 05/08/2022  Pt will be independent with progression of HEP for improved balance, gait, posture, strength. Baseline:  Goal status: IN PROGRESS  2.  Pt will improve TUG score to less than or equal to 13.5 sec for decreased fall risk. Baseline:  Goal status: IN PROGRESS  3.  Pt will verbalize tips to decrease hastening, shuffling with gait. Baseline:  Goal status: IN PROGRESS  4.  6MWT to improve to at least 715 ft, with <2 episodes to stop and reset for hastening of gait. Baseline: 678 ft with 4 episodes of brief stops Goal status: IN PROGRESS  ASSESSMENT:  CLINICAL IMPRESSION: Skilled PT session today focused on review of  PWR! Moves, continuing to address intensity of movement patterns.  Also worked on flexibility, balance and step length with resisted gait.  Pt responds well to resisted gait as a cue for improved foot clearance and posture with gait.  With resistance removed, he does revert back to shuffling, decreased foot clearance, wider BOS pattern after about 50 ft and with turns.  He will continue to benefit from skilled PT towards goals for improved functional mobility and balance.    OBJECTIVE IMPAIRMENTS: Abnormal gait, decreased balance, decreased mobility, difficulty walking, decreased strength, and postural dysfunction.   ACTIVITY LIMITATIONS: standing, stairs, transfers, and locomotion level  PARTICIPATION LIMITATIONS: shopping, community activity, and family activities  PERSONAL FACTORS: 3+ comorbidities: see PMH  are also affecting patient's functional outcome.   REHAB POTENTIAL: Good  CLINICAL DECISION MAKING: Evolving/moderate complexity  EVALUATION COMPLEXITY: Moderate  PLAN:  PT FREQUENCY: 2x/week  PT DURATION: 6 weeks (may not meet frequency due to pt out of town over the holidays)  PLANNED INTERVENTIONS: Therapeutic exercises, Therapeutic activity, Neuromuscular re-education, Balance training, Gait training, Patient/Family education, and Self Care  PLAN FOR NEXT SESSION: Check STGs; postural strengthening, resisted gait, SLS;  add to HEP as appropriate.    Gean Maidens., PT 04/24/2022, 11:14 AM [facility] at [facility] [address] [place], [address] [place], [place] [address] Phone # [phone] Fax # [phone]

## 2022-04-28 ENCOUNTER — Ambulatory Visit: Payer: Medicare HMO | Admitting: Physical Therapy

## 2022-04-28 ENCOUNTER — Encounter: Payer: Self-pay | Admitting: Physical Therapy

## 2022-04-28 DIAGNOSIS — M6281 Muscle weakness (generalized): Secondary | ICD-10-CM | POA: Diagnosis not present

## 2022-04-28 DIAGNOSIS — R2681 Unsteadiness on feet: Secondary | ICD-10-CM

## 2022-04-28 DIAGNOSIS — R2689 Other abnormalities of gait and mobility: Secondary | ICD-10-CM

## 2022-04-28 NOTE — Therapy (Signed)
OUTPATIENT PHYSICAL THERAPY NEURO TREATMENT NOTE   Patient Name: Kyle Conner MRN: [id number] DOB:12-25-1950, 72 y.o., male Today's Date: 04/28/2022   PCP: Dellis Filbert D. Doy Hutching, MD REFERRING PROVIDER: Tally Due, MD   END OF SESSION:  PT End of Session - 04/28/22 1104     Visit Number 5    Number of Visits 12    Date for PT Re-Evaluation 05/08/22    Authorization Type [employer]    Authorization Time Period 03/31/2022-05/08/2022    Authorization - Visit Number 5    Authorization - Number of Visits 12    Progress Note Due on Visit 10    PT Start Time 1105    PT Stop Time 1147    PT Time Calculation (min) 42 min    Equipment Utilized During Treatment Gait belt    Activity Tolerance Patient tolerated treatment well    Behavior During Therapy WFL for tasks assessed/performed             History reviewed. No pertinent past medical history. History reviewed. No pertinent surgical history. There are no problems to display for this patient.   ONSET DATE: 03/19/2022 (MD referral/visit)  REFERRING DIAG:  R26.89 (ICD-10-CM) - Other abnormalities of gait and mobility  R26.9 (ICD-10-CM) - Unspecified abnormalities of gait and mobility  R27.8 (ICD-10-CM) - Other lack of coordination    THERAPY DIAG:  Other abnormalities of gait and mobility  Unsteadiness on feet  Rationale for Evaluation and Treatment: Rehabilitation  SUBJECTIVE:                                                                                                                                                                                             SUBJECTIVE STATEMENT: Doing fine.  I'm more aware of my posture and try to fix it.   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 No  PRECAUTIONS: Fall  WEIGHT BEARING RESTRICTIONS: No  FALLS: Has patient fallen in last 6 months?  Yes. Number of falls 1  LIVING ENVIRONMENT: Lives with: lives with their family Lives in: House/apartment Stairs: Yes: Internal: 12 steps; on right going up and sleeps downstairs and External: 3 steps; on right going up Has following equipment at home: None  PLOF: Independent  PATIENT GOALS: To walk more  OBJECTIVE:    TODAY'S TREATMENT: 04/28/2022 Activity Comments  5x sit<>stand 13.62 sec, then 11.56 sec with cues for increased intensity   Postural strengthening:  at doorframe, no resistance:  scapular retraction x 10, neck retraction/chin tucks with  towel behind head, 2 x 10 reps -progressed to doorway resisted red theraband for scapular retraction 2 x 10, rows 2 x 10, single arm shoulder extension x 10 reps -horizontal abduction 2 x 10 reps, red band (seated)-theraband doubled Cues for technique  Resisted gait at weightrack, 15-30#, 5 reps each forward walk, then backward, min guard Cues for posture with forward walk and eccentric control in posterior direction  Progressed to walking around gym with blue theraband/PT providing resistance to gait at hips, 85 ft x 4 reps Pt able to make 1/2 lap, then 3/4 lap with good foot clearance, upright posture (does need cues for this)  Gait 85 ft no resistance, cues for arm swing, foot clearance Able to make full lap with cues  Attempted gait with single/bilateral walking poles, 85 ft x 2 reps Difficulty with sequencing (pole may be too tall); pt puts pole too far forward and continues to fast pace of steps    *Attempted to add postural strengthening to HEP and [employer] link not working today.*  Access Code: [email or web address] URL: [email or web address] Date: 04/21/2022 Prepared by: [facility] - Outpatient  Rehab - [facility]  Exercises - Standing Gastroc Stretch at Counter  - 2 x daily - 7 x weekly - 1 sets - 3 reps - 15-30 sec hold *Pt also has pictures of standing and seated PWR! Moves, 1x/day, 10-20  reps  ---------------------------------------------------------------------------------------------------- Measures below taken at initial evaluation:  Vitals:  O2 sats 96%, HR 66 bpm (audibly SOB upon sit<>stand and short distance gait)  DIAGNOSTIC FINDINGS: MRI shows CVA in the past (small lacunar infarct)  COGNITION: Overall cognitive status: Within functional limits for tasks assessed   SENSATION: Light touch: WFL  COORDINATION: Reports L side is slower to move at times  MUSCLE TONE: No significant changes with P/ROM BLEs  POSTURE: rounded shoulders and forward head  LOWER EXTREMITY ROM:   WFL BLEs   LOWER EXTREMITY MMT:    MMT Right Eval Left Eval  Hip flexion 5 5  Hip extension    Hip abduction    Hip adduction    Hip internal rotation    Hip external rotation    Knee flexion 5 5  Knee extension 5 5  Ankle dorsiflexion 4+ 4  Ankle plantarflexion    Ankle inversion    Ankle eversion    (Blank rows = not tested)  TRANSFERS: Assistive device utilized: None  Sit to stand: Modified independence Stand to sit: Modified independence   GAIT: Gait pattern: step through pattern, decreased step length- Right, decreased step length- Left, decreased ankle dorsiflexion- Right, decreased ankle dorsiflexion- Left, shuffling, wide BOS, poor foot clearance- Right, and poor foot clearance- Left Distance walked: 60 ft Assistive device utilized: None Level of assistance: Modified independence and SBA Comments: Able to briefly improve foot clearance/heelstrike with cues  FUNCTIONAL TESTS:  5 times sit to stand: 13.81 sec no UE support Timed up and go (TUG): 14.6 sec 10 meter walk test: 11.59 sec (2.83 ft/sec) MiniBESTest :  23/28 TUG cognitive:  14.37 sec TUG Manual:  13.87 sec   TODAY'S TREATMENT:  DATE: 03/31/2022    PATIENT  EDUCATION: Education details: Eval results, POC; answered pt's initial questions about walking poles and provided cues for posture upon sit<>stand and with gait for improved foot clearance Person educated: Patient Education method: Explanation Education comprehension: verbalized understanding  HOME EXERCISE PROGRAM: Not yet initiated  GOALS: Goals reviewed with patient? Yes  SHORT TERM GOALS: Target date: 04/24/2022  Pt will be independent with HEP for improved balance, gait, posture, strength. Baseline: Goal status: GOAL MET, 04/28/2022  2.  Pt will improve 5x sit<>stand to less than or equal to 12.5 sec to demonstrate improved functional strength and transfer efficiency.  Baseline: 13.81 sec> 11.56 at best with cues Goal status: GOAL MET, 04/28/2022  LONG TERM GOALS: Target date: 05/08/2022  Pt will be independent with progression of HEP for improved balance, gait, posture, strength. Baseline:  Goal status: IN PROGRESS  2.  Pt will improve TUG score to less than or equal to 13.5 sec for decreased fall risk. Baseline:  Goal status: IN PROGRESS  3.  Pt will verbalize tips to decrease hastening, shuffling with gait. Baseline:  Goal status: IN PROGRESS  4.  6MWT to improve to at least 715 ft, with <2 episodes to stop and reset for hastening of gait. Baseline: 678 ft with 4 episodes of brief stops Goal status: IN PROGRESS  ASSESSMENT:  CLINICAL IMPRESSION: Assessed STGs this visit, with pt meeting 2 of 2 STGs.  Focused session today on postural strengthening/awareness and resisted gait>no resistance with gait to attempt carryover for improved posture, foot clearance, step length with gait.  Pt is able, after resisted gait and with cues, to improve walking with good foot clearance from 1/2 lap to 3/4 lap.  Trialed use of single and bilateral walking poles today, and pt has difficulty sequencing, with hastened gait pattern with continued gait.  Working on strategies to improve  distance that patient is able to walk prior to forward posture and hastening of gait; he continues to need cues, otherwise, he reverts to more hastening pattern.  He will continue to benefit from skilled PT towards LTGs.   OBJECTIVE IMPAIRMENTS: Abnormal gait, decreased balance, decreased mobility, difficulty walking, decreased strength, and postural dysfunction.   ACTIVITY LIMITATIONS: standing, stairs, transfers, and locomotion level  PARTICIPATION LIMITATIONS: shopping, community activity, and family activities  PERSONAL FACTORS: 3+ comorbidities: see PMH  are also affecting patient's functional outcome.   REHAB POTENTIAL: Good  CLINICAL DECISION MAKING: Evolving/moderate complexity  EVALUATION COMPLEXITY: Moderate  PLAN:  PT FREQUENCY: 2x/week  PT DURATION: 6 weeks (may not meet frequency due to pt out of town over the holidays)  PLANNED INTERVENTIONS: Therapeutic exercises, Therapeutic activity, Neuromuscular re-education, Balance training, Gait training, Patient/Family education, and Self Care  PLAN FOR NEXT SESSION: Try to add to HEP for postural strengthening, resisted gait, SLS; add to HEP as appropriate.    Frazier Butt., PT 04/28/2022, 11:54 AM [facility] at [facility], [place], [place] [address] Phone # [phone] Fax # [phone]

## 2022-05-01 ENCOUNTER — Ambulatory Visit: Payer: Medicare HMO | Admitting: Physical Therapy

## 2022-05-04 ENCOUNTER — Ambulatory Visit: Payer: Medicare HMO | Admitting: Physical Therapy

## 2022-05-04 DIAGNOSIS — J40 Bronchitis, not specified as acute or chronic: Secondary | ICD-10-CM | POA: Diagnosis not present

## 2022-05-05 ENCOUNTER — Ambulatory Visit: Payer: Medicare HMO

## 2022-05-07 ENCOUNTER — Ambulatory Visit: Payer: Medicare HMO | Admitting: Physical Therapy

## 2022-05-20 ENCOUNTER — Ambulatory Visit: Payer: Medicare HMO | Attending: Neurology | Admitting: Physical Therapy

## 2022-05-20 ENCOUNTER — Encounter: Payer: Self-pay | Admitting: Physical Therapy

## 2022-05-20 DIAGNOSIS — R2681 Unsteadiness on feet: Secondary | ICD-10-CM | POA: Diagnosis not present

## 2022-05-20 DIAGNOSIS — R2689 Other abnormalities of gait and mobility: Secondary | ICD-10-CM | POA: Insufficient documentation

## 2022-05-20 DIAGNOSIS — M6281 Muscle weakness (generalized): Secondary | ICD-10-CM | POA: Diagnosis not present

## 2022-05-20 NOTE — Therapy (Signed)
OUTPATIENT PHYSICAL THERAPY NEURO TREATMENT NOTE/RECERT/PROGRESS NOTE   Patient Name: Kyle Conner MRN: [id number] DOB:27-Jun-1950, 72 y.o., male Today's Date: 05/20/2022   PCP: Tinnie Gens D. Judithann Sheen, MD REFERRING PROVIDER: Verlin Grills, MD   Progress Note Reporting Period 03/31/2022 to 05/20/2021  See note below for Objective Data and Assessment of Progress/Goals.      END OF SESSION:  PT End of Session - 05/20/22 1450     Visit Number 6    Number of Visits 12    Date for PT Re-Evaluation 06/12/22    Authorization Type [employer]    Authorization Time Period 03/31/2022-05/08/2022; resubmitted 05/20/2022 upon return to PT    Authorization - Number of Visits --    Progress Note Due on Visit 16    PT Start Time 1450    PT Stop Time 1532    PT Time Calculation (min) 42 min    Equipment Utilized During Treatment Gait belt    Activity Tolerance Patient tolerated treatment well    Behavior During Therapy WFL for tasks assessed/performed              History reviewed. No pertinent past medical history. History reviewed. No pertinent surgical history. There are no problems to display for this patient.   ONSET DATE: 03/19/2022 (MD referral/visit)  REFERRING DIAG:  R26.89 (ICD-10-CM) - Other abnormalities of gait and mobility  R26.9 (ICD-10-CM) - Unspecified abnormalities of gait and mobility  R27.8 (ICD-10-CM) - Other lack of coordination    THERAPY DIAG:  Other abnormalities of gait and mobility  Unsteadiness on feet  Muscle weakness (generalized)  Rationale for Evaluation and Treatment: Rehabilitation  SUBJECTIVE:                                                                                                                                                                                             SUBJECTIVE STATEMENT: Had a bout of bronchitis, so I was pretty much flat on my back for >2 weeks.  Completely off of the Carbidopa-Levodopa and  notice very little tremor.  Maybe my walking is feeling a little better. 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 No  PRECAUTIONS: Fall  WEIGHT BEARING RESTRICTIONS: No  FALLS: Has patient fallen in last 6 months? Yes. Number of falls 1  LIVING ENVIRONMENT: Lives with: lives with their family Lives in: House/apartment Stairs: Yes: Internal: 12 steps; on right going up and sleeps downstairs and External: 3 steps; on right going up Has following equipment at home: None  PLOF:  Independent  PATIENT GOALS: To walk more; 05/20/2022:  Continue to work on walking  OBJECTIVE:    TODAY'S TREATMENT: 05/20/2022 Activity Comments  5x sit<>stand:  13.63 sec with decreased forward lean   TUG:  12 sec TUG cognitive:  12.25 sec   6MWT:   Vitals:  161/93 HR 64 bpm O2:  93% Distance: 925 ft, no rest breaks, 2 episodes with turns of forward flexed posture Post vitals:  164/98 HR 67 O2 93% Around 3 minute mark:  pt begins slightly more forward posture and decreased SLS/foot clearance with gait  FGA score:  21/30           [facility] PT Assessment - 05/20/22 0001       Functional Gait  Assessment   Gait assessed  Yes    Gait Level Surface Walks 20 ft, slow speed, abnormal gait pattern, evidence for imbalance or deviates 10-15 in outside of the 12 in walkway width. Requires more than 7 sec to ambulate 20 ft.    Change in Gait Speed Able to change speed, demonstrates mild gait deviations, deviates 6-10 in outside of the 12 in walkway width, or no gait deviations, unable to achieve a major change in velocity, or uses a change in velocity, or uses an assistive device.    Gait with Horizontal Head Turns Performs head turns smoothly with slight change in gait velocity (eg, minor disruption to smooth gait path), deviates 6-10 in outside 12 in walkway width, or uses an assistive device.   8 sec    Gait with Vertical Head Turns Performs task with slight change in gait velocity (eg, minor disruption to smooth gait path), deviates 6 - 10 in outside 12 in walkway width or uses assistive device   8.19 sec   Gait and Pivot Turn Pivot turns safely within 3 sec and stops quickly with no loss of balance.    Step Over Obstacle Is able to step over 2 stacked shoe boxes taped together (9 in total height) without changing gait speed. No evidence of imbalance.    Gait with Narrow Base of Support Is able to ambulate for 10 steps heel to toe with no staggering.    Gait with Eyes Closed Walks 20 ft, uses assistive device, slower speed, mild gait deviations, deviates 6-10 in outside 12 in walkway width. Ambulates 20 ft in less than 9 sec but greater than 7 sec.    Ambulating Backwards Walks 20 ft, slow speed, abnormal gait pattern, evidence for imbalance, deviates 10-15 in outside 12 in walkway width.    Steps Alternating feet, must use rail.    Total Score 21             PATIENT EDUCATION: Education details: PT review of goals, POC Person educated: Patient Education method: Explanation Education comprehension: verbalized understanding     *Attempted to add postural strengthening to HEP and [place] link not working   Access Code: [email or web address] URL: [email or web address] Date: 04/21/2022 Prepared by: [facility]  Exercises - Standing Gastroc Stretch at Counter  - 2 x daily - 7 x weekly - 1 sets - 3 reps - 15-30 sec hold *Pt also has pictures of standing and seated PWR! Moves, 1x/day, 10-20 reps  ---------------------------------------------------------------------------------------------------- Measures below taken at initial evaluation:  Vitals:  O2 sats 96%, HR 66 bpm (audibly SOB upon sit<>stand and short distance gait)  DIAGNOSTIC FINDINGS: MRI shows CVA in the past (small lacunar infarct)  COGNITION: Overall cognitive  status: Within  functional limits for tasks assessed   SENSATION: Light touch: WFL  COORDINATION: Reports L side is slower to move at times  MUSCLE TONE: No significant changes with P/ROM BLEs  POSTURE: rounded shoulders and forward head  LOWER EXTREMITY ROM:   WFL BLEs   LOWER EXTREMITY MMT:    MMT Right Eval Left Eval  Hip flexion 5 5  Hip extension    Hip abduction    Hip adduction    Hip internal rotation    Hip external rotation    Knee flexion 5 5  Knee extension 5 5  Ankle dorsiflexion 4+ 4  Ankle plantarflexion    Ankle inversion    Ankle eversion    (Blank rows = not tested)  TRANSFERS: Assistive device utilized: None  Sit to stand: Modified independence Stand to sit: Modified independence   GAIT: Gait pattern: step through pattern, decreased step length- Right, decreased step length- Left, decreased ankle dorsiflexion- Right, decreased ankle dorsiflexion- Left, shuffling, wide BOS, poor foot clearance- Right, and poor foot clearance- Left Distance walked: 60 ft Assistive device utilized: None Level of assistance: Modified independence and SBA Comments: Able to briefly improve foot clearance/heelstrike with cues  FUNCTIONAL TESTS:  5 times sit to stand: 13.81 sec no UE support Timed up and go (TUG): 14.6 sec 10 meter walk test: 11.59 sec (2.83 ft/sec) MiniBESTest :  23/28 TUG cognitive:  14.37 sec TUG Manual:  13.87 sec   TODAY'S TREATMENT:                                                                                                                              DATE: 03/31/2022    PATIENT EDUCATION: Education details: Eval results, POC; answered pt's initial questions about walking poles and provided cues for posture upon sit<>stand and with gait for improved foot clearance Person educated: Patient Education method: Explanation Education comprehension: verbalized understanding  HOME EXERCISE PROGRAM: Not yet initiated  GOALS: Goals reviewed with  patient? Yes  SHORT TERM GOALS: Target date: 04/24/2022  Pt will be independent with HEP for improved balance, gait, posture, strength. Baseline: Goal status: GOAL MET, 04/28/2022  2.  Pt will improve 5x sit<>stand to less than or equal to 12.5 sec to demonstrate improved functional strength and transfer efficiency.  Baseline: 13.81 sec> 11.56 at best with cues Goal status: GOAL MET, 04/28/2022  LONG TERM GOALS: Target date: 05/08/2022  Pt will be independent with progression of HEP for improved balance, gait, posture, strength. Baseline: HEP was initiated, then pt out due to illnes Goal status: PARTIALLY MET, 05/20/2022  2.  Pt will improve TUG score to less than or equal to 13.5 sec for decreased fall risk. Baseline: 12 sec 05/20/2022 Goal status: GOAL MET, 05/20/2022  3.  Pt will verbalize tips to decrease hastening, shuffling with gait. Baseline: Stop, reset Goal status: PARTIALLY MET 05/20/2022  4.  6MWT to improve to at least 715 ft, with <  2 episodes to stop and reset for hastening of gait. Baseline: 678 ft with 4 episodes of brief stops; 925 ft forward posture at 3 minutes, 05/20/2022 Goal status: GOAL MET   UPDATED GOALS 05/20/2022 >TARGET 06/12/2022  LONG TERM GOALS: Target date: 06/12/2022  Pt will be independent with HEP for improved strength, balance, transfers, and gait. Baseline:  Goal status: INITIAL  2.  Pt will improve 5x sit<>stand to less than or equal to 12.5 sec to demonstrate improved functional strength and transfer efficiency. Baseline: 13.63 sec 05/20/2022 Goal status: INITIAL  3.  Pt will verbalize tips to decrease hastening, shuffling with gait. Baseline:  Goal status: IN PROGRESS  4.  Pt will improve FGA score to at least 25/30 for improved gait efficiency and safety. Baseline: 21/30 Goal status: INITIAL  5.  Pt will verbalize understanding of local PD resources, including community fitness. Baseline:  Goal status: INITIAL   ASSESSMENT:  CLINICAL  IMPRESSION: Progress Note, spanning dates 03/31/2022-05/20/2022.  Subjectively, pt reports he had to cancel last appointments and missed PT since mid-January due to bronchitis.  He reports being flat on his back and feels slightly weaker.  He reports being fully off Carbidopa-Levodopa and noted slightly less tremors.  Objective measures:  5x sit<>stand:  13.63 sec with difficulty initiating first trial; TUG 12 sec, TUG cognitive 12.25 sec (improved from 14.6/14.37 sec at eval).  FGA score 21/30 indicates increased fall risk.  6MWT test 925 ft with significant increase in distance since last check (678 ft), but pt has increased forward flexed posture at 3 minutes with increased double leg stance time(continues wider BOS, decreased foot clearance and step length).  He has met 2 of 4 LTGs.  LTG 1 and 3 partially met.  LTG 2 and 4 met.  He will benefit from skilled PT to address optimal fitness program as well as work on gait training, posture, dynamic balance to decrease fall risk and improve overall functional mobility.  OBJECTIVE IMPAIRMENTS: Abnormal gait, decreased balance, decreased mobility, difficulty walking, decreased strength, and postural dysfunction.   ACTIVITY LIMITATIONS: standing, stairs, transfers, and locomotion level  PARTICIPATION LIMITATIONS: shopping, community activity, and family activities  PERSONAL FACTORS: 3+ comorbidities: see PMH  are also affecting patient's functional outcome.   REHAB POTENTIAL: Good  CLINICAL DECISION MAKING: Evolving/moderate complexity  EVALUATION COMPLEXITY: Moderate  PLAN:  PT FREQUENCY: 2x/week  PT DURATION: 4 weeks (including 07/18/960 recert)  PLANNED INTERVENTIONS: Therapeutic exercises, Therapeutic activity, Neuromuscular re-education, Balance training, Gait training, Patient/Family education, and Self Care  PLAN FOR NEXT SESSION: Try to add to HEP for postural strengthening, resisted gait, SLS; add to HEP as appropriate.  Walking poles,  optimal fitness routine, walking program  Charels Stambaugh W., PT 05/20/2022, 5:04 PM [facility] at [facility], [place], [place] [address] Phone # [phone] Fax # [phone]

## 2022-05-28 ENCOUNTER — Encounter: Payer: Self-pay | Admitting: Physical Therapy

## 2022-05-28 ENCOUNTER — Ambulatory Visit: Payer: Medicare HMO | Admitting: Physical Therapy

## 2022-05-28 DIAGNOSIS — R2681 Unsteadiness on feet: Secondary | ICD-10-CM | POA: Diagnosis not present

## 2022-05-28 DIAGNOSIS — R2689 Other abnormalities of gait and mobility: Secondary | ICD-10-CM | POA: Diagnosis not present

## 2022-05-28 DIAGNOSIS — M6281 Muscle weakness (generalized): Secondary | ICD-10-CM

## 2022-05-28 NOTE — Patient Instructions (Addendum)
Optimal Fitness Program after Therapy  1)  Flexibility  See stretches in HEP  Perform daily, 1-2x/day  2)  Walking -  Work up to walking 3-5 times per week, 20-30 minutes per day (START 5-10 minutes 2-3 times/day)  -This can be done at home, driveway, quiet street or an indoor track  -Focus should be on your Best posture, arm swing, step length for your best  walking pattern  3)  Aerobic Exercise  -Work up to 3-5 times per week, 30 minutes per day  -This can be stationary bike, seated stepper machine, elliptical machine  -Work up to 7-8/10 intensity during the exercise, at minimal to moderate     Resistance

## 2022-05-28 NOTE — Therapy (Signed)
OUTPATIENT PHYSICAL THERAPY NEURO TREATMENT NOTE   Patient Name: Kyle Conner MRN: [id number] DOB:[birth year], 72 y.o., male Today's Date: 05/28/2022   PCP: Dellis Filbert D. Doy Hutching, MD REFERRING PROVIDER: Tally Due, MD       END OF SESSION:  PT End of Session - 05/28/22 1719     Visit Number 7    Number of Visits 12    Date for PT Re-Evaluation 06/12/22    Authorization Type [employer]    Authorization Time Period 03/31/2022-05/08/2022; resubmitted 05/20/2022 upon return to PT    Authorization - Visit Number 2    Authorization - Number of Visits 8    Progress Note Due on Visit 16    PT Start Time 1620    PT Stop Time 1711    PT Time Calculation (min) 51 min    Activity Tolerance Patient tolerated treatment well    Behavior During Therapy [facility] for tasks assessed/performed              History reviewed. No pertinent past medical history. History reviewed. No pertinent surgical history. There are no problems to display for this patient.   ONSET DATE: 03/19/2022 (MD referral/visit)  REFERRING DIAG:  R26.89 (ICD-10-CM) - Other abnormalities of gait and mobility  R26.9 (ICD-10-CM) - Unspecified abnormalities of gait and mobility  R27.8 (ICD-10-CM) - Other lack of coordination    THERAPY DIAG:  Other abnormalities of gait and mobility  Unsteadiness on feet  Muscle weakness (generalized)  Rationale for Evaluation and Treatment: Rehabilitation  SUBJECTIVE:                                                                                                                                                                                             SUBJECTIVE STATEMENT: Totally off the medication (Levo-dopa) and feel like my balance is better. 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 No  PRECAUTIONS: Fall  WEIGHT  BEARING RESTRICTIONS: No  FALLS: Has patient fallen in last 6 months? Yes. Number of falls 1  LIVING ENVIRONMENT: Lives with: lives with their family Lives in: House/apartment Stairs: Yes: Internal: 12 steps; on right going up and sleeps downstairs and External: 3 steps; on right going up Has following equipment at home: None  PLOF: Independent  PATIENT GOALS: To walk more; 05/20/2022:  Continue to work on walking  OBJECTIVE:   [place] before ex:  HR 74 bpm, O2 sats 96% After bike:  74 bpm, O2 sats 94%  TODAY'S TREATMENT: 05/28/2022 Activity Comments  Seated bike x 4:30 for flexibility and aerobic warm-up, combination of BLEs and Ues, Level 1>1.3 Cues for attention to speed, >50 RPM, able to get up to >60 for 30 seconds.  Rates effort level as 6/10  Flexibility exercises: -seated hamstring stretch, 3 x 30 sec -standing gastroc stretch 2 x 30 sec (review of his HEP) -standing ant/posterior weightshift and rocking for hamstring, gastroc, low back, hip flexor stretch Requires cues for technique  Gait in clinic, 50 ft x 6 reps, then 85 ft x 2 Cues for *increased step length/heelstrike and *posture to look ahead at target.  Pt begins to have decreased foot clearance after about 50 ft.  Cue to STOP and reset       PATIENT EDUCATION: Education details: Optimal fitness program discussion:  today, discussed flexibility, aerobic activity and walking for exercise.  Provided handout on this (see pt instructions for 05/28/2022 visit) and updated HEP for stretches.  Discussed cues/strategies for posture, foot clearance with gait:  STOP and reset when he begins to shuffle, try NOT to hear his feet scuffing with gait Person educated: Patient and Spouse Education method: Explanation, Demonstration, and Handouts Education comprehension: verbalized understanding, returned demonstration, and needs further education  Access Code: [email or web address] URL: [email or web address] Date:  05/28/2022 Prepared by: [facility]  Exercises - Standing Gastroc Stretch at Counter  - 2 x daily - 7 x weekly - 1 sets - 3 reps - 15-30 sec hold - Seated Hamstring Stretch  - 1 x daily - 7 x weekly - 1 sets - 3 reps - 30 sec hold - Standing Lumbar Spine Flexion Stretch Counter  - 1-2 x daily - 7 x weekly - 1 sets - 3 reps - 30 sec hold  Per patient instructions:  Optimal Fitness Program after Therapy  1)  Flexibility  See stretches in HEP  Perform daily, 1-2x/day  2)  Walking -  Work up to walking 3-5 times per week, 20-30 minutes per day (START 5-10 minutes 2-3 times/day)  -This can be done at home, driveway, quiet street or an indoor track  -Focus should be on your Best posture, arm swing, step length for your best  walking pattern  3)  Aerobic Exercise  -Work up to 3-5 times per week, 30 minutes per day  -This can be stationary bike, seated stepper machine, elliptical machine  -Work up to 7-8/10 intensity during the exercise, at minimal to moderate     Resistance                ---------------------------------------------------------------------------------------------------- Measures below taken at initial evaluation:  Vitals:  O2 sats 96%, HR 66 bpm (audibly SOB upon sit<>stand and short distance gait)  DIAGNOSTIC FINDINGS: MRI shows CVA in the past (small lacunar infarct)  COGNITION: Overall cognitive status: Within functional limits for tasks assessed   SENSATION: Light touch: WFL  COORDINATION: Reports L side is slower to move at times  MUSCLE TONE: No significant changes with P/ROM BLEs  POSTURE: rounded shoulders and forward head  LOWER EXTREMITY ROM:   WFL BLEs   LOWER EXTREMITY MMT:    MMT Right Eval Left Eval  Hip flexion 5 5  Hip extension    Hip abduction    Hip adduction    Hip internal rotation    Hip external rotation    Knee flexion 5 5  Knee extension 5 5  Ankle dorsiflexion 4+ 4  Ankle  plantarflexion    Ankle inversion  Ankle eversion    (Blank rows = not tested)  TRANSFERS: Assistive device utilized: None  Sit to stand: Modified independence Stand to sit: Modified independence   GAIT: Gait pattern: step through pattern, decreased step length- Right, decreased step length- Left, decreased ankle dorsiflexion- Right, decreased ankle dorsiflexion- Left, shuffling, wide BOS, poor foot clearance- Right, and poor foot clearance- Left Distance walked: 60 ft Assistive device utilized: None Level of assistance: Modified independence and SBA Comments: Able to briefly improve foot clearance/heelstrike with cues  FUNCTIONAL TESTS:  5 times sit to stand: 13.81 sec no UE support Timed up and go (TUG): 14.6 sec 10 meter walk test: 11.59 sec (2.83 ft/sec) MiniBESTest :  23/28 TUG cognitive:  14.37 sec TUG Manual:  13.87 sec   TODAY'S TREATMENT:                                                                                                                              DATE: 03/31/2022    PATIENT EDUCATION: Education details: Eval results, POC; answered pt's initial questions about walking poles and provided cues for posture upon sit<>stand and with gait for improved foot clearance Person educated: Patient Education method: Explanation Education comprehension: verbalized understanding  HOME EXERCISE PROGRAM: Not yet initiated  GOALS: Goals reviewed with patient? Yes   UPDATED GOALS 05/20/2022 >TARGET 06/12/2022  LONG TERM GOALS: Target date: 06/12/2022  Pt will be independent with HEP for improved strength, balance, transfers, and gait. Baseline:  Goal status: INITIAL  2.  Pt will improve 5x sit<>stand to less than or equal to 12.5 sec to demonstrate improved functional strength and transfer efficiency. Baseline: 13.63 sec 05/20/2022 Goal status: INITIAL  3.  Pt will verbalize tips to decrease hastening, shuffling with gait. Baseline:  Goal status: IN  PROGRESS  4.  Pt will improve FGA score to at least 25/30 for improved gait efficiency and safety. Baseline: 21/30 Goal status: INITIAL  5.  Pt will verbalize understanding of local PD resources, including community fitness. Baseline:  Goal status: INITIAL   ASSESSMENT:  CLINICAL IMPRESSION: Wife present with patient for skilled PT session, which address ways to incorporate optimal fitness/exercises into his daily/weekly routine.  Discussed, educated on, and practiced flexibility, aerobic exercises as well as gait with strategies to lengthen time of gait with best posture and foot clearance.  After about 50 ft today, pt begins to have more shuffling pattern.  PT provides cues and educates pt/wife on strategy to STOP and reset posture and START again with BIG effort for step length and posture.  Answered wife's questions and updated HEP for additional flexibility/stretching exercises and optimal fitness program.  He will continue tobenefit from skilled PT to address optimal fitness program as well as work on gait training, posture, dynamic balance to decrease fall risk and improve overall functional mobility.  OBJECTIVE IMPAIRMENTS: Abnormal gait, decreased balance, decreased mobility, difficulty walking, decreased strength, and postural dysfunction.   ACTIVITY LIMITATIONS: standing, stairs,  transfers, and locomotion level  PARTICIPATION LIMITATIONS: shopping, community activity, and family activities  PERSONAL FACTORS: 3+ comorbidities: see PMH  are also affecting patient's functional outcome.   REHAB POTENTIAL: Good  CLINICAL DECISION MAKING: Evolving/moderate complexity  EVALUATION COMPLEXITY: Moderate  PLAN:  PT FREQUENCY: 2x/week  PT DURATION: 4 weeks (including 0000000 recert)  PLANNED INTERVENTIONS: Therapeutic exercises, Therapeutic activity, Neuromuscular re-education, Balance training, Gait training, Patient/Family education, and Self Care  PLAN FOR NEXT SESSION:  Review optimal fitness program and updated HEP.  Walking pole instruction if appropriate.  Discuss strengthening component to exercise routine.  Frazier Butt., PT 05/28/2022, 5:22 PM [facility] [address] [place], [place], [place] [address] Phone # [phone] Fax # [phone]

## 2022-06-02 ENCOUNTER — Ambulatory Visit: Payer: Medicare HMO | Admitting: Physical Therapy

## 2022-06-04 ENCOUNTER — Encounter: Payer: Self-pay | Admitting: Physical Therapy

## 2022-06-04 ENCOUNTER — Ambulatory Visit: Payer: Medicare HMO | Admitting: Physical Therapy

## 2022-06-04 DIAGNOSIS — R2689 Other abnormalities of gait and mobility: Secondary | ICD-10-CM

## 2022-06-04 DIAGNOSIS — R2681 Unsteadiness on feet: Secondary | ICD-10-CM | POA: Diagnosis not present

## 2022-06-04 DIAGNOSIS — M6281 Muscle weakness (generalized): Secondary | ICD-10-CM | POA: Diagnosis not present

## 2022-06-04 NOTE — Therapy (Signed)
OUTPATIENT PHYSICAL THERAPY NEURO TREATMENT NOTE   Patient Name: Kyle Conner MRN: [id number] DOB:[birth year], 72 y.o., male Today's Date: 06/04/2022   PCP: Leonie Douglas. Doy Hutching, MD REFERRING PROVIDER: Tally Due, MD       END OF SESSION:  PT End of Session - 06/04/22 1043     Visit Number 8    Number of Visits 12    Date for PT Re-Evaluation 06/12/22    Authorization Type [employer]    Authorization Time Period 8 visits approved 05/20/2022-06/12/2022    Authorization - Visit Number 3    Authorization - Number of Visits 8    Progress Note Due on Visit 16    PT Start Time 1055    PT Stop Time 1145    PT Time Calculation (min) 50 min    Activity Tolerance Patient tolerated treatment well    Behavior During Therapy [facility] for tasks assessed/performed              History reviewed. No pertinent past medical history. History reviewed. No pertinent surgical history. There are no problems to display for this patient.   ONSET DATE: 03/19/2022 (MD referral/visit)  REFERRING DIAG:  R26.89 (ICD-10-CM) - Other abnormalities of gait and mobility  R26.9 (ICD-10-CM) - Unspecified abnormalities of gait and mobility  R27.8 (ICD-10-CM) - Other lack of coordination    THERAPY DIAG:  Other abnormalities of gait and mobility  Unsteadiness on feet  Muscle weakness (generalized)  Rationale for Evaluation and Treatment: Rehabilitation  SUBJECTIVE:                                                                                                                                                                                             SUBJECTIVE STATEMENT: Trying to walk about 15 minutes in the house.  Wife reminds me when she hears my feet scuff.  Have not done the stretches. 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  No  PRECAUTIONS: Fall  WEIGHT BEARING RESTRICTIONS: No  FALLS: Has patient fallen in last 6 months? Yes. Number of falls 1  LIVING ENVIRONMENT: Lives with: lives with their family Lives in: House/apartment Stairs: Yes: Internal: 12 steps; on right going up and sleeps downstairs and External: 3 steps; on right going up Has following equipment at home: None  PLOF: Independent  PATIENT GOALS: To walk more; 05/20/2022:  Continue to work on walking  OBJECTIVE:   Vitals before ex:  HR 69 bpm, O2 sats 95% After bike:  71  bpm, O2 sats 96%   TODAY'S TREATMENT: 06/04/2022 Activity Comments  NuStep, Level 3, 4 extremities x 8 minutes, varied to increase resistance up to Level 4 SPM >90, rates effort level as 4>8/10  Reviewed HEP (stretches from last visit) -hamstring stretch, seated and foot propped on block, 2 x 30" -standing lumbar stretch, 3 reps x 10" Return demo understanding  Gait training in clinic 450 ft, cues for increased step length, heelstrike, arm swing PT provides cues at least 2x to stop and reset due to decreased foot clearance.  Standing strengthening: -hip abduction 2 x 10 -hip extension 2 x 10 -hamstring curls 2 x 10 -hip flexion/knee straight 2 x 10 -hip/knee flexion 2 x 10 2# weights, cues for upright posture throughout  Sidestep/together 2 x 10, 2nd set over hurdle for foot clearance   Postural exercise, red band: -seated horizontal abd x 10  -then in standing x 10 -shoulder extension x 10 -scapular squeezes x 10 Initial cues for technique  Gait, figure-8 patterns around furniture, 4 laps, with cues for step length, heelstrike, arm swing PT provides cues x 1 to stop and reset for improved foot clearance   PATIENT EDUCATION: Education details: HEP additions-strength Person educated: Patient Education method: Consulting civil engineer, Demonstration, and Handouts Education comprehension: verbalized understanding, returned demonstration, and needs further education  Access  Code: [email or web address] URL: [email or web address] Date: 06/04/2022 Prepared by: [facility]  Exercises - Standing Gastroc Stretch at Counter  - 2 x daily - 7 x weekly - 1 sets - 3 reps - 15-30 sec hold - Seated Hamstring Stretch  - 1 x daily - 7 x weekly - 1 sets - 3 reps - 30 sec hold - Standing Lumbar Spine Flexion Stretch Counter  - 1-2 x daily - 7 x weekly - 1 sets - 3 reps - 30 sec hold - Standing 3-way Hip with Walker  - 1 x daily - 3 x weekly - 3 sets - 10 reps - March in Place  - 1 x daily - 3 x weekly - 3 sets - 10 reps - Standing Knee Flexion  - 1 x daily - 3 x weekly - 3 sets - 10 reps   Per patient instructions:  Optimal Fitness Program after Therapy  1)  Flexibility  See stretches in HEP  Perform daily, 1-2x/day  2)  Walking -  Work up to walking 3-5 times per week, 20-30 minutes per day (START 5-10 minutes 2-3 times/day)  -This can be done at home, driveway, quiet street or an indoor track  -Focus should be on your Best posture, arm swing, step length for your best  walking pattern  3)  Aerobic Exercise  -Work up to 3-5 times per week, 30 minutes per day  -This can be stationary bike, seated stepper machine, elliptical machine  -Work up to 7-8/10 intensity during the exercise, at minimal to moderate     Resistance                ---------------------------------------------------------------------------------------------------- Measures below taken at initial evaluation:  Vitals:  O2 sats 96%, HR 66 bpm (audibly SOB upon sit<>stand and short distance gait)  DIAGNOSTIC FINDINGS: MRI shows CVA in the past (small lacunar infarct)  COGNITION: Overall cognitive status: Within functional limits for tasks assessed   SENSATION: Light touch: WFL  COORDINATION: Reports L side is slower to move at times  MUSCLE TONE: No significant changes with P/ROM BLEs  POSTURE: rounded shoulders and forward  head  LOWER EXTREMITY  ROM:   WFL BLEs   LOWER EXTREMITY MMT:    MMT Right Eval Left Eval  Hip flexion 5 5  Hip extension    Hip abduction    Hip adduction    Hip internal rotation    Hip external rotation    Knee flexion 5 5  Knee extension 5 5  Ankle dorsiflexion 4+ 4  Ankle plantarflexion    Ankle inversion    Ankle eversion    (Blank rows = not tested)  TRANSFERS: Assistive device utilized: None  Sit to stand: Modified independence Stand to sit: Modified independence   GAIT: Gait pattern: step through pattern, decreased step length- Right, decreased step length- Left, decreased ankle dorsiflexion- Right, decreased ankle dorsiflexion- Left, shuffling, wide BOS, poor foot clearance- Right, and poor foot clearance- Left Distance walked: 60 ft Assistive device utilized: None Level of assistance: Modified independence and SBA Comments: Able to briefly improve foot clearance/heelstrike with cues  FUNCTIONAL TESTS:  5 times sit to stand: 13.81 sec no UE support Timed up and go (TUG): 14.6 sec 10 meter walk test: 11.59 sec (2.83 ft/sec) MiniBESTest :  23/28 TUG cognitive:  14.37 sec TUG Manual:  13.87 sec    GOALS: Goals reviewed with patient? Yes   UPDATED GOALS 05/20/2022 >TARGET 06/12/2022  LONG TERM GOALS: Target date: 06/12/2022  Pt will be independent with HEP for improved strength, balance, transfers, and gait. Baseline:  Goal status: INITIAL  2.  Pt will improve 5x sit<>stand to less than or equal to 12.5 sec to demonstrate improved functional strength and transfer efficiency. Baseline: 13.63 sec 05/20/2022 Goal status: INITIAL  3.  Pt will verbalize tips to decrease hastening, shuffling with gait. Baseline:  Goal status: IN PROGRESS  4.  Pt will improve FGA score to at least 25/30 for improved gait efficiency and safety. Baseline: 21/30 Goal status: INITIAL  5.  Pt will verbalize understanding of local PD resources, including community fitness. Baseline:  Goal status:  INITIAL   ASSESSMENT:  CLINICAL IMPRESSION: Skilled PT session today focused on review of strategies for foot clearance and maintaining upright posture for gait.  Pt verbalizes strategies, but in clinic today, he needs PT cues to stop and reset to lessen episodes of decreased foot clearance,hastening of gait.  Added strengthening exercises to pt's HEP.  He does report not yet adding new stretches into routine or trying the gym.  Continued to encourage importance of optimal fitness program as outlined last visit.  (Pt only seen once this week due to PT out sick earlier this week and unable to be rescheduled).  OBJECTIVE IMPAIRMENTS: Abnormal gait, decreased balance, decreased mobility, difficulty walking, decreased strength, and postural dysfunction.   ACTIVITY LIMITATIONS: standing, stairs, transfers, and locomotion level  PARTICIPATION LIMITATIONS: shopping, community activity, and family activities  PERSONAL FACTORS: 3+ comorbidities: see PMH  are also affecting patient's functional outcome.   REHAB POTENTIAL: Good  CLINICAL DECISION MAKING: Evolving/moderate complexity  EVALUATION COMPLEXITY: Moderate  PLAN:  PT FREQUENCY: 2x/week  PT DURATION: 4 weeks (including 0000000 recert)  PLANNED INTERVENTIONS: Therapeutic exercises, Therapeutic activity, Neuromuscular re-education, Balance training, Gait training, Patient/Family education, and Self Care  PLAN FOR NEXT SESSION: Review optimal fitness program and updated HEP.  Walking poles instruction if appropriate.  Review strengthening component to exercise routine.  Outdoor gait.  Will need to assess LTGs and discuss POC.  Frazier Butt., PT 06/04/2022, 12:43 PM  [facility] at [facility] [address]  [place], [place], [place] [address] Phone # [phone] Fax # [phone]

## 2022-06-09 ENCOUNTER — Encounter: Payer: Self-pay | Admitting: Physical Therapy

## 2022-06-09 ENCOUNTER — Ambulatory Visit: Payer: Medicare HMO | Admitting: Physical Therapy

## 2022-06-09 DIAGNOSIS — M6281 Muscle weakness (generalized): Secondary | ICD-10-CM | POA: Diagnosis not present

## 2022-06-09 DIAGNOSIS — R2681 Unsteadiness on feet: Secondary | ICD-10-CM | POA: Diagnosis not present

## 2022-06-09 DIAGNOSIS — R2689 Other abnormalities of gait and mobility: Secondary | ICD-10-CM

## 2022-06-09 NOTE — Therapy (Unsigned)
OUTPATIENT PHYSICAL THERAPY NEURO TREATMENT NOTE   Patient Name: Kyle Conner MRN: [id number] DOB:1950/09/22, 72 y.o., male Today's Date: 06/10/2022   PCP: Leonie Douglas. Doy Hutching, MD REFERRING PROVIDER: Tally Due, MD       END OF SESSION:  PT End of Session - 06/09/22 1405     Visit Number 9    Number of Visits 12    Date for PT Re-Evaluation 06/12/22    Authorization Type [employer]    Authorization Time Period 8 visits approved 05/20/2022-06/12/2022    Authorization - Visit Number 4    Authorization - Number of Visits 8    Progress Note Due on Visit 16    PT Start Time [id number]    PT Stop Time 1446    PT Time Calculation (min) 42 min    Activity Tolerance Patient tolerated treatment well    Behavior During Therapy [facility] for tasks assessed/performed              History reviewed. No pertinent past medical history. History reviewed. No pertinent surgical history. There are no problems to display for this patient.   ONSET DATE: 03/19/2022 (MD referral/visit)  REFERRING DIAG:  R26.89 (ICD-10-CM) - Other abnormalities of gait and mobility  R26.9 (ICD-10-CM) - Unspecified abnormalities of gait and mobility  R27.8 (ICD-10-CM) - Other lack of coordination    THERAPY DIAG:  Other abnormalities of gait and mobility  Unsteadiness on feet  Muscle weakness (generalized)  Rationale for Evaluation and Treatment: Rehabilitation  SUBJECTIVE:                                                                                                                                                                                             SUBJECTIVE STATEMENT: Went to the gym yesterday and used seated stepper and some of the leg machines.  Plan to go tomorrow, so will try 3x/wk.  Feel like my walking is not as good this week. 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 No  PRECAUTIONS: Fall  WEIGHT BEARING RESTRICTIONS: No  FALLS: Has patient fallen in last 6 months? Yes. Number of falls 1  LIVING ENVIRONMENT: Lives with: lives with their family Lives in: House/apartment Stairs: Yes: Internal: 12 steps; on right going up and sleeps downstairs and External: 3 steps; on right going up Has following equipment at home: None  PLOF: Independent  PATIENT GOALS: To walk more; 05/20/2022:  Continue to work on walking  OBJECTIVE:     TODAY'S TREATMENT: 06/09/2022 Activity Comments  Standing strengthening: -hip abduction x 10 -hip extension x 10  -hamstring curls  x 10 -hip flexion/knee straight x 10 -hip/knee flexion x 10 2# weights, cues for upright posture throughout Review of HEP, pt return demo with min cues for posture  Walking with attentional focus: Cues for "walk in the sand", "ice skate", step wide and stop,for improved posture and balance   Gait 50 ft x 4, in clinic with noted improved arm swing, cues to reset for upright posture   Worked on PWR! Moves for posture: Modified prone position (at wall) Modified quadruped position at wall Standing facing wall PWR! Up FLOW, x 10 reps total with visual, verbal, tactile cues             Access Code: [email or web address] URL: [email or web address] Date: 06/04/2022 Prepared by: [facility]  Exercises - Standing Gastroc Stretch at Counter  - 2 x daily - 7 x weekly - 1 sets - 3 reps - 15-30 sec hold - Seated Hamstring Stretch  - 1 x daily - 7 x weekly - 1 sets - 3 reps - 30 sec hold - Standing Lumbar Spine Flexion Stretch Counter  - 1-2 x daily - 7 x weekly - 1 sets - 3 reps - 30 sec hold - Standing 3-way Hip with Walker  - 1 x daily - 3 x weekly - 3 sets - 10 reps - March in Place  - 1 x daily - 3 x weekly - 3 sets - 10 reps - Standing Knee Flexion  - 1 x daily - 3 x weekly - 3 sets - 10 reps   Per patient instructions:  Optimal  Fitness Program after Therapy  1)  Flexibility  See stretches in HEP  Perform daily, 1-2x/day  2)  Walking -  Work up to walking 3-5 times per week, 20-30 minutes per day (START 5-10 minutes 2-3 times/day)  -This can be done at home, driveway, quiet street or an indoor track  -Focus should be on your Best posture, arm swing, step length for your best  walking pattern  3)  Aerobic Exercise  -Work up to 3-5 times per week, 30 minutes per day  -This can be stationary bike, seated stepper machine, elliptical machine  -Work up to 7-8/10 intensity during the exercise, at minimal to moderate     Resistance                ---------------------------------------------------------------------------------------------------- Measures below taken at initial evaluation:  Vitals:  O2 sats 96%, HR 66 bpm (audibly SOB upon sit<>stand and short distance gait)  DIAGNOSTIC FINDINGS: MRI shows CVA in the past (small lacunar infarct)  COGNITION: Overall cognitive status: Within functional limits for tasks assessed   SENSATION: Light touch: WFL  COORDINATION: Reports L side is slower to move at times  MUSCLE TONE: No significant changes with P/ROM BLEs  POSTURE: rounded shoulders and forward head  LOWER EXTREMITY ROM:   WFL BLEs   LOWER EXTREMITY MMT:    MMT Right Eval Left Eval  Hip flexion 5 5  Hip extension    Hip abduction    Hip adduction    Hip internal rotation    Hip external rotation    Knee flexion 5 5  Knee extension 5 5  Ankle dorsiflexion 4+ 4  Ankle plantarflexion    Ankle inversion    Ankle eversion    (Blank rows = not tested)  TRANSFERS: Assistive device utilized: None  Sit  to stand: Modified independence Stand to sit: Modified independence   GAIT: Gait pattern: step through pattern, decreased step length- Right, decreased step length- Left, decreased ankle dorsiflexion- Right, decreased ankle dorsiflexion- Left, shuffling, wide BOS, poor foot clearance-  Right, and poor foot clearance- Left Distance walked: 60 ft Assistive device utilized: None Level of assistance: Modified independence and SBA Comments: Able to briefly improve foot clearance/heelstrike with cues  FUNCTIONAL TESTS:  5 times sit to stand: 13.81 sec no UE support Timed up and go (TUG): 14.6 sec 10 meter walk test: 11.59 sec (2.83 ft/sec) MiniBESTest :  23/28 TUG cognitive:  14.37 sec TUG Manual:  13.87 sec    GOALS: Goals reviewed with patient? Yes   UPDATED GOALS 05/20/2022 >TARGET 06/12/2022  LONG TERM GOALS: Target date: 06/12/2022  Pt will be independent with HEP for improved strength, balance, transfers, and gait. Baseline:  Goal status: INITIAL  2.  Pt will improve 5x sit<>stand to less than or equal to 12.5 sec to demonstrate improved functional strength and transfer efficiency. Baseline: 13.63 sec 05/20/2022 Goal status: INITIAL  3.  Pt will verbalize tips to decrease hastening, shuffling with gait. Baseline:  Goal status: IN PROGRESS  4.  Pt will improve FGA score to at least 25/30 for improved gait efficiency and safety. Baseline: 21/30 Goal status: INITIAL  5.  Pt will verbalize understanding of local PD resources, including community fitness. Baseline:  Goal status: INITIAL   ASSESSMENT:  CLINICAL IMPRESSION: Skilled PT session focused on review of strengthening exercises plus additional attentional focus on gait and posture.  He continues to demonstrate forward flexed posture and hastening with gait at times, but with attentional focus tasks with gait, he is able to maintain balance and SLS well.  Worked on [employer]! Up FLOW in modified prone>quadruped>standing position for dynamic and sustained posture strengthening.  He will benefit from additions to HEP for posture.  He feels like he may be ready to hold PT for a short time to get into the gym, after this week.  Will discuss further next visit.  OBJECTIVE IMPAIRMENTS: Abnormal gait, decreased  balance, decreased mobility, difficulty walking, decreased strength, and postural dysfunction.   ACTIVITY LIMITATIONS: standing, stairs, transfers, and locomotion level  PARTICIPATION LIMITATIONS: shopping, community activity, and family activities  PERSONAL FACTORS: 3+ comorbidities: see PMH  are also affecting patient's functional outcome.   REHAB POTENTIAL: Good  CLINICAL DECISION MAKING: Evolving/moderate complexity  EVALUATION COMPLEXITY: Moderate  PLAN:  PT FREQUENCY: 2x/week  PT DURATION: 4 weeks (including 0000000 recert)  PLANNED INTERVENTIONS: Therapeutic exercises, Therapeutic activity, Neuromuscular re-education, Balance training, Gait training, Patient/Family education, and Self Care  PLAN FOR NEXT SESSION: PWR! Up FLOW added to HEP-consider filming with phone for cues for home.  Walking poles instruction/outdoor gait.  Will need to assess LTGs and discuss POC.  Frazier Butt., PT 06/10/2022, 5:11 PM  [facility] at [facility] [place], [place], [place] [address] Phone # [phone] Fax # [phone]

## 2022-06-11 ENCOUNTER — Ambulatory Visit: Payer: Medicare HMO | Admitting: Physical Therapy

## 2022-06-11 ENCOUNTER — Encounter: Payer: Self-pay | Admitting: Physical Therapy

## 2022-06-11 DIAGNOSIS — M6281 Muscle weakness (generalized): Secondary | ICD-10-CM | POA: Diagnosis not present

## 2022-06-11 DIAGNOSIS — R2681 Unsteadiness on feet: Secondary | ICD-10-CM | POA: Diagnosis not present

## 2022-06-11 DIAGNOSIS — R2689 Other abnormalities of gait and mobility: Secondary | ICD-10-CM

## 2022-06-11 NOTE — Therapy (Signed)
OUTPATIENT PHYSICAL THERAPY NEURO TREATMENT NOTE/DISCHARGE SUMMARY   Patient Name: Kyle Conner MRN: [id number] DOB:1950/10/10, 72 y.o., male Today's Date: 06/12/2022   PCP: Dellis Filbert D. Doy Hutching, MD REFERRING PROVIDER: Tally Due, MD     PHYSICAL THERAPY DISCHARGE SUMMARY  Visits from Start of Care: 10  Current functional level related to goals / functional outcomes: See goal information below-pt has met 5 of 5 LTGs.   Remaining deficits: Posture, balance, hastening of gait-improving   Education / Equipment: HEP, community fitness resources   Patient agrees to discharge. Patient goals were met. Patient is being discharged due to meeting the stated rehab goals.  Pt is pleased with current level of function and would benefit from return for PT screen in 6 months.   END OF SESSION:  PT End of Session - 06/11/22 1237     Visit Number 10    Number of Visits 12    Date for PT Re-Evaluation 06/12/22    Authorization Type [employer]    Authorization Time Period 8 visits approved 05/20/2022-06/12/2022    Authorization - Visit Number 5    Authorization - Number of Visits 8    Progress Note Due on Visit 16    PT Start Time [id number]    PT Stop Time 1315    PT Time Calculation (min) 40 min    Activity Tolerance Patient tolerated treatment well    Behavior During Therapy [facility] for tasks assessed/performed              History reviewed. No pertinent past medical history. History reviewed. No pertinent surgical history. There are no problems to display for this patient.   ONSET DATE: 03/19/2022 (MD referral/visit)  REFERRING DIAG:  R26.89 (ICD-10-CM) - Other abnormalities of gait and mobility  R26.9 (ICD-10-CM) - Unspecified abnormalities of gait and mobility  R27.8 (ICD-10-CM) - Other lack of coordination    THERAPY DIAG:  Other abnormalities of gait and mobility  Unsteadiness on feet  Muscle weakness (generalized)  Rationale for Evaluation and  Treatment: Rehabilitation  SUBJECTIVE:                                                                                                                                                                                             SUBJECTIVE STATEMENT: Want to discharge today from PT and continue to work at gym and home exercises. 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 No  PRECAUTIONS: Fall  WEIGHT BEARING RESTRICTIONS: No  FALLS: Has patient fallen in last 6  months? Yes. Number of falls 1  LIVING ENVIRONMENT: Lives with: lives with their family Lives in: House/apartment Stairs: Yes: Internal: 12 steps; on right going up and sleeps downstairs and External: 3 steps; on right going up Has following equipment at home: None  PLOF: Independent  PATIENT GOALS: To walk more; 05/20/2022:  Continue to work on walking  OBJECTIVE:    TODAY'S TREATMENT: 06/11/2022 Activity Comments  5x sit<>stand: 9.06 sec Improved from 13.97 sec  TUG: 12.22 sec Improved from 14.06 sec  25M: 11.66 sec (2.81 ft/sec)   FGA:  25/30 Improved from 21/30  Reviewed tips to decrease hastening of gait Pt verbalizes-see goal below  Worked on [employer]! Moves for posture: Modified prone position (at wall) Modified quadruped position at wall Standing facing wall PWR! Up FLOW, x 6 reps total with visual, verbal, tactile cues  Gait on outdoor surfaces, using bilat walking poles 40 ft x 4 reps Max verbal, visual, tactile cues.  Pt with difficulty sequencing reciprocal poles and reverts to smaller step length  Gait on outdoor surfaces, using single walking pole, 50 ft x 4 reps Verbal and visual cues, pt able to sequence better and use single pole for upright posture    [facility] PT Assessment - 06/12/22 0001       Functional Gait  Assessment   Gait assessed  Yes    Gait Level Surface Walks 20 ft in less than 7 sec but  greater than 5.5 sec, uses assistive device, slower speed, mild gait deviations, or deviates 6-10 in outside of the 12 in walkway width.    Change in Gait Speed Able to change speed, demonstrates mild gait deviations, deviates 6-10 in outside of the 12 in walkway width, or no gait deviations, unable to achieve a major change in velocity, or uses a change in velocity, or uses an assistive device.    Gait with Horizontal Head Turns Performs head turns smoothly with slight change in gait velocity (eg, minor disruption to smooth gait path), deviates 6-10 in outside 12 in walkway width, or uses an assistive device.    Gait with Vertical Head Turns Performs task with slight change in gait velocity (eg, minor disruption to smooth gait path), deviates 6 - 10 in outside 12 in walkway width or uses assistive device    Gait and Pivot Turn Pivot turns safely within 3 sec and stops quickly with no loss of balance.    Step Over Obstacle Is able to step over 2 stacked shoe boxes taped together (9 in total height) without changing gait speed. No evidence of imbalance.    Gait with Narrow Base of Support Is able to ambulate for 10 steps heel to toe with no staggering.    Gait with Eyes Closed Walks 20 ft, uses assistive device, slower speed, mild gait deviations, deviates 6-10 in outside 12 in walkway width. Ambulates 20 ft in less than 9 sec but greater than 7 sec.    Ambulating Backwards Walks 20 ft, no assistive devices, good speed, no evidence for imbalance, normal gait    Steps Alternating feet, no rail.    Total Score 25    FGA comment: Score improved from 21/30            PATIENT EDUCATION: Education details: POC, progress towards goals, plan for d/c today.  Walking pole instruction/addition to HEP for PWR! Up FLOW (video'd for pt's reference at home) Person educated: Patient Education method: Explanation, Demonstration, Verbal cues, and video on pt's phone  Education comprehension: verbalized  understanding and returned demonstration     Access Code: [email or web address] URL: [email or web address] Date: 06/04/2022 Prepared by: [facility]  Exercises - Standing Gastroc Stretch at Counter  - 2 x daily - 7 x weekly - 1 sets - 3 reps - 15-30 sec hold - Seated Hamstring Stretch  - 1 x daily - 7 x weekly - 1 sets - 3 reps - 30 sec hold - Standing Lumbar Spine Flexion Stretch Counter  - 1-2 x daily - 7 x weekly - 1 sets - 3 reps - 30 sec hold - Standing 3-way Hip with Walker  - 1 x daily - 3 x weekly - 3 sets - 10 reps - March in Place  - 1 x daily - 3 x weekly - 3 sets - 10 reps - Standing Knee Flexion  - 1 x daily - 3 x weekly - 3 sets - 10 reps *ADDED 06/11/2022:  Modified prone>modified quadruped>stand PWR! Up FLOW, 2 x 5 reps daily  Per patient instructions:  Optimal Fitness Program after Therapy  1)  Flexibility  See stretches in HEP  Perform daily, 1-2x/day  2)  Walking -  Work up to walking 3-5 times per week, 20-30 minutes per day (START 5-10 minutes 2-3 times/day)  -This can be done at home, driveway, quiet street or an indoor track  -Focus should be on your Best posture, arm swing, step length for your best  walking pattern  3)  Aerobic Exercise  -Work up to 3-5 times per week, 30 minutes per day  -This can be stationary bike, seated stepper machine, elliptical machine  -Work up to 7-8/10 intensity during the exercise, at minimal to moderate     Resistance                ---------------------------------------------------------------------------------------------------- Measures below taken at initial evaluation:  Vitals:  O2 sats 96%, HR 66 bpm (audibly SOB upon sit<>stand and short distance gait)  DIAGNOSTIC FINDINGS: MRI shows CVA in the past (small lacunar infarct)  COGNITION: Overall cognitive status: Within functional limits for tasks assessed   SENSATION: Light touch: WFL  COORDINATION: Reports L side is  slower to move at times  MUSCLE TONE: No significant changes with P/ROM BLEs  POSTURE: rounded shoulders and forward head  LOWER EXTREMITY ROM:   WFL BLEs   LOWER EXTREMITY MMT:    MMT Right Eval Left Eval  Hip flexion 5 5  Hip extension    Hip abduction    Hip adduction    Hip internal rotation    Hip external rotation    Knee flexion 5 5  Knee extension 5 5  Ankle dorsiflexion 4+ 4  Ankle plantarflexion    Ankle inversion    Ankle eversion    (Blank rows = not tested)  TRANSFERS: Assistive device utilized: None  Sit to stand: Modified independence Stand to sit: Modified independence   GAIT: Gait pattern: step through pattern, decreased step length- Right, decreased step length- Left, decreased ankle dorsiflexion- Right, decreased ankle dorsiflexion- Left, shuffling, wide BOS, poor foot clearance- Right, and poor foot clearance- Left Distance walked: 60 ft Assistive device utilized: None Level of assistance: Modified independence and SBA Comments: Able to briefly improve foot clearance/heelstrike with cues  FUNCTIONAL TESTS:  5 times sit to stand: 13.81 sec no UE support Timed up and go (TUG): 14.6 sec 10 meter walk test: 11.59 sec (2.83 ft/sec) MiniBESTest :  23/28 TUG cognitive:  14.37 sec TUG Manual:  13.87 sec    GOALS: Goals reviewed with patient? Yes   UPDATED GOALS 05/20/2022 >TARGET 06/12/2022  LONG TERM GOALS: Target date: 06/12/2022  Pt will be independent with HEP for improved strength, balance, transfers, and gait. Baseline:  Goal status: MET  2.  Pt will improve 5x sit<>stand to less than or equal to 12.5 sec to demonstrate improved functional strength and transfer efficiency. Baseline: 13.63 sec 05/20/2022>9.06 sec 06/11/2022 Goal status: MET  3.  Pt will verbalize tips to decrease hastening, shuffling with gait. Baseline: 06/11/2022;  reports stopping and resetting, standing up straight and swinging arms Goal status: MET  4.  Pt will  improve FGA score to at least 25/30 for improved gait efficiency and safety. Baseline: 21/30>25/30 06/11/2022 Goal status: MET  5.  Pt will verbalize understanding of local PD resources, including community fitness. Baseline:  Goal status: MET   ASSESSMENT:  CLINICAL IMPRESSION: Assessed LTGs this visit, with pt meeting all 5 of 5 LTGs.  Pt's gait pattern appears more smooth today with slightly less hastening of gait.  He verbalizes/demo techniques to maintain more upright posture with gait, and he has improved FTSTS and FGA scores during course of therapy.  Instructed in final additions to HEP today-PWR! Up FLOW and instructed in single walking pole for gait activities as part of HEP.  Pt has gone to gym x 2 this week and is verbalizing aerobic and strength activities as part of his routine.  He is appropriate for d/c from PT at this time and PT recommends follow up PT screen in 6 months.    OBJECTIVE IMPAIRMENTS: Abnormal gait, decreased balance, decreased mobility, difficulty walking, decreased strength, and postural dysfunction.   ACTIVITY LIMITATIONS: standing, stairs, transfers, and locomotion level  PARTICIPATION LIMITATIONS: shopping, community activity, and family activities  PERSONAL FACTORS: 3+ comorbidities: see PMH  are also affecting patient's functional outcome.   REHAB POTENTIAL: Good  CLINICAL DECISION MAKING: Evolving/moderate complexity  EVALUATION COMPLEXITY: Moderate  PLAN:  PT FREQUENCY: 2x/week  PT DURATION: 4 weeks (including 0000000 recert)  PLANNED INTERVENTIONS: Therapeutic exercises, Therapeutic activity, Neuromuscular re-education, Balance training, Gait training, Patient/Family education, and Self Care  PLAN FOR NEXT SESSION: Discharge this visit, with plans for return screen in 6 months.  Frazier Butt., PT 06/12/2022, 7:43 AM  [facility] at [facility] [place], [place], [place] [address] Phone #  [phone] Fax # [phone]

## 2022-06-17 DIAGNOSIS — G214 Vascular parkinsonism: Secondary | ICD-10-CM | POA: Diagnosis not present

## 2022-07-23 DIAGNOSIS — R4189 Other symptoms and signs involving cognitive functions and awareness: Secondary | ICD-10-CM | POA: Diagnosis not present

## 2022-07-23 DIAGNOSIS — G219 Secondary parkinsonism, unspecified: Secondary | ICD-10-CM | POA: Diagnosis not present

## 2022-09-01 DIAGNOSIS — Z9889 Other specified postprocedural states: Secondary | ICD-10-CM | POA: Diagnosis not present

## 2022-09-01 DIAGNOSIS — G4733 Obstructive sleep apnea (adult) (pediatric): Secondary | ICD-10-CM | POA: Diagnosis not present

## 2022-09-01 DIAGNOSIS — I2510 Atherosclerotic heart disease of native coronary artery without angina pectoris: Secondary | ICD-10-CM | POA: Diagnosis not present

## 2022-09-01 DIAGNOSIS — I10 Essential (primary) hypertension: Secondary | ICD-10-CM | POA: Diagnosis not present

## 2022-09-01 DIAGNOSIS — R0602 Shortness of breath: Secondary | ICD-10-CM | POA: Diagnosis not present

## 2022-09-01 DIAGNOSIS — E782 Mixed hyperlipidemia: Secondary | ICD-10-CM | POA: Diagnosis not present

## 2022-09-02 DIAGNOSIS — G214 Vascular parkinsonism: Secondary | ICD-10-CM | POA: Diagnosis not present

## 2022-09-03 DIAGNOSIS — H2513 Age-related nuclear cataract, bilateral: Secondary | ICD-10-CM | POA: Diagnosis not present

## 2022-09-03 DIAGNOSIS — H5213 Myopia, bilateral: Secondary | ICD-10-CM | POA: Diagnosis not present

## 2022-09-03 DIAGNOSIS — H43811 Vitreous degeneration, right eye: Secondary | ICD-10-CM | POA: Diagnosis not present

## 2022-09-03 DIAGNOSIS — H40023 Open angle with borderline findings, high risk, bilateral: Secondary | ICD-10-CM | POA: Diagnosis not present

## 2022-09-03 DIAGNOSIS — Z8669 Personal history of other diseases of the nervous system and sense organs: Secondary | ICD-10-CM | POA: Diagnosis not present

## 2022-09-29 DIAGNOSIS — I10 Essential (primary) hypertension: Secondary | ICD-10-CM | POA: Diagnosis not present

## 2022-09-29 DIAGNOSIS — E7800 Pure hypercholesterolemia, unspecified: Secondary | ICD-10-CM | POA: Diagnosis not present

## 2022-09-29 DIAGNOSIS — Z79899 Other long term (current) drug therapy: Secondary | ICD-10-CM | POA: Diagnosis not present

## 2022-09-29 DIAGNOSIS — R7309 Other abnormal glucose: Secondary | ICD-10-CM | POA: Diagnosis not present

## 2022-10-06 DIAGNOSIS — I2510 Atherosclerotic heart disease of native coronary artery without angina pectoris: Secondary | ICD-10-CM | POA: Diagnosis not present

## 2022-10-06 DIAGNOSIS — E669 Obesity, unspecified: Secondary | ICD-10-CM | POA: Diagnosis not present

## 2022-10-06 DIAGNOSIS — G20A1 Parkinson's disease without dyskinesia, without mention of fluctuations: Secondary | ICD-10-CM | POA: Diagnosis not present

## 2022-10-06 DIAGNOSIS — R059 Cough, unspecified: Secondary | ICD-10-CM | POA: Diagnosis not present

## 2022-10-06 DIAGNOSIS — Z79899 Other long term (current) drug therapy: Secondary | ICD-10-CM | POA: Diagnosis not present

## 2022-10-06 DIAGNOSIS — I10 Essential (primary) hypertension: Secondary | ICD-10-CM | POA: Diagnosis not present

## 2022-10-06 DIAGNOSIS — Z0000 Encounter for general adult medical examination without abnormal findings: Secondary | ICD-10-CM | POA: Diagnosis not present

## 2022-10-06 DIAGNOSIS — R7303 Prediabetes: Secondary | ICD-10-CM | POA: Diagnosis not present

## 2022-10-06 DIAGNOSIS — E785 Hyperlipidemia, unspecified: Secondary | ICD-10-CM | POA: Diagnosis not present

## 2023-01-07 DIAGNOSIS — Z79899 Other long term (current) drug therapy: Secondary | ICD-10-CM | POA: Diagnosis not present

## 2023-01-07 DIAGNOSIS — R7303 Prediabetes: Secondary | ICD-10-CM | POA: Diagnosis not present

## 2023-01-13 DIAGNOSIS — M5416 Radiculopathy, lumbar region: Secondary | ICD-10-CM | POA: Diagnosis not present

## 2023-01-13 DIAGNOSIS — M9903 Segmental and somatic dysfunction of lumbar region: Secondary | ICD-10-CM | POA: Diagnosis not present

## 2023-01-13 DIAGNOSIS — M9905 Segmental and somatic dysfunction of pelvic region: Secondary | ICD-10-CM | POA: Diagnosis not present

## 2023-01-13 DIAGNOSIS — M62830 Muscle spasm of back: Secondary | ICD-10-CM | POA: Diagnosis not present

## 2023-01-15 DIAGNOSIS — M62830 Muscle spasm of back: Secondary | ICD-10-CM | POA: Diagnosis not present

## 2023-01-15 DIAGNOSIS — M9903 Segmental and somatic dysfunction of lumbar region: Secondary | ICD-10-CM | POA: Diagnosis not present

## 2023-01-15 DIAGNOSIS — M9905 Segmental and somatic dysfunction of pelvic region: Secondary | ICD-10-CM | POA: Diagnosis not present

## 2023-01-15 DIAGNOSIS — M5416 Radiculopathy, lumbar region: Secondary | ICD-10-CM | POA: Diagnosis not present

## 2023-01-18 DIAGNOSIS — M9903 Segmental and somatic dysfunction of lumbar region: Secondary | ICD-10-CM | POA: Diagnosis not present

## 2023-01-18 DIAGNOSIS — M9905 Segmental and somatic dysfunction of pelvic region: Secondary | ICD-10-CM | POA: Diagnosis not present

## 2023-01-18 DIAGNOSIS — M62830 Muscle spasm of back: Secondary | ICD-10-CM | POA: Diagnosis not present

## 2023-01-18 DIAGNOSIS — M5416 Radiculopathy, lumbar region: Secondary | ICD-10-CM | POA: Diagnosis not present

## 2023-01-20 DIAGNOSIS — M5416 Radiculopathy, lumbar region: Secondary | ICD-10-CM | POA: Diagnosis not present

## 2023-01-20 DIAGNOSIS — M62830 Muscle spasm of back: Secondary | ICD-10-CM | POA: Diagnosis not present

## 2023-01-20 DIAGNOSIS — M9903 Segmental and somatic dysfunction of lumbar region: Secondary | ICD-10-CM | POA: Diagnosis not present

## 2023-01-20 DIAGNOSIS — M9905 Segmental and somatic dysfunction of pelvic region: Secondary | ICD-10-CM | POA: Diagnosis not present

## 2023-01-22 DIAGNOSIS — M62830 Muscle spasm of back: Secondary | ICD-10-CM | POA: Diagnosis not present

## 2023-01-22 DIAGNOSIS — M9903 Segmental and somatic dysfunction of lumbar region: Secondary | ICD-10-CM | POA: Diagnosis not present

## 2023-01-22 DIAGNOSIS — M9905 Segmental and somatic dysfunction of pelvic region: Secondary | ICD-10-CM | POA: Diagnosis not present

## 2023-01-22 DIAGNOSIS — M5416 Radiculopathy, lumbar region: Secondary | ICD-10-CM | POA: Diagnosis not present

## 2023-01-25 DIAGNOSIS — M9905 Segmental and somatic dysfunction of pelvic region: Secondary | ICD-10-CM | POA: Diagnosis not present

## 2023-01-25 DIAGNOSIS — M62830 Muscle spasm of back: Secondary | ICD-10-CM | POA: Diagnosis not present

## 2023-01-25 DIAGNOSIS — M9903 Segmental and somatic dysfunction of lumbar region: Secondary | ICD-10-CM | POA: Diagnosis not present

## 2023-01-25 DIAGNOSIS — M5416 Radiculopathy, lumbar region: Secondary | ICD-10-CM | POA: Diagnosis not present

## 2023-01-27 DIAGNOSIS — M9903 Segmental and somatic dysfunction of lumbar region: Secondary | ICD-10-CM | POA: Diagnosis not present

## 2023-01-27 DIAGNOSIS — M62830 Muscle spasm of back: Secondary | ICD-10-CM | POA: Diagnosis not present

## 2023-01-27 DIAGNOSIS — M9905 Segmental and somatic dysfunction of pelvic region: Secondary | ICD-10-CM | POA: Diagnosis not present

## 2023-01-27 DIAGNOSIS — M5416 Radiculopathy, lumbar region: Secondary | ICD-10-CM | POA: Diagnosis not present

## 2023-02-01 DIAGNOSIS — M62830 Muscle spasm of back: Secondary | ICD-10-CM | POA: Diagnosis not present

## 2023-02-01 DIAGNOSIS — M9903 Segmental and somatic dysfunction of lumbar region: Secondary | ICD-10-CM | POA: Diagnosis not present

## 2023-02-01 DIAGNOSIS — M9905 Segmental and somatic dysfunction of pelvic region: Secondary | ICD-10-CM | POA: Diagnosis not present

## 2023-02-01 DIAGNOSIS — M5416 Radiculopathy, lumbar region: Secondary | ICD-10-CM | POA: Diagnosis not present

## 2023-02-03 DIAGNOSIS — M62830 Muscle spasm of back: Secondary | ICD-10-CM | POA: Diagnosis not present

## 2023-02-03 DIAGNOSIS — M5416 Radiculopathy, lumbar region: Secondary | ICD-10-CM | POA: Diagnosis not present

## 2023-02-03 DIAGNOSIS — M9903 Segmental and somatic dysfunction of lumbar region: Secondary | ICD-10-CM | POA: Diagnosis not present

## 2023-02-03 DIAGNOSIS — M9905 Segmental and somatic dysfunction of pelvic region: Secondary | ICD-10-CM | POA: Diagnosis not present

## 2023-02-04 DIAGNOSIS — Z800 Family history of malignant neoplasm of digestive organs: Secondary | ICD-10-CM | POA: Diagnosis not present

## 2023-02-04 DIAGNOSIS — Z860101 Personal history of adenomatous and serrated colon polyps: Secondary | ICD-10-CM | POA: Diagnosis not present

## 2023-02-04 DIAGNOSIS — G214 Vascular parkinsonism: Secondary | ICD-10-CM | POA: Diagnosis not present

## 2023-02-08 DIAGNOSIS — M5416 Radiculopathy, lumbar region: Secondary | ICD-10-CM | POA: Diagnosis not present

## 2023-02-08 DIAGNOSIS — M9905 Segmental and somatic dysfunction of pelvic region: Secondary | ICD-10-CM | POA: Diagnosis not present

## 2023-02-08 DIAGNOSIS — M9903 Segmental and somatic dysfunction of lumbar region: Secondary | ICD-10-CM | POA: Diagnosis not present

## 2023-02-08 DIAGNOSIS — M62830 Muscle spasm of back: Secondary | ICD-10-CM | POA: Diagnosis not present

## 2023-02-11 ENCOUNTER — Ambulatory Visit: Payer: Medicare HMO | Admitting: Physical Therapy

## 2023-02-16 DIAGNOSIS — M9903 Segmental and somatic dysfunction of lumbar region: Secondary | ICD-10-CM | POA: Diagnosis not present

## 2023-02-16 DIAGNOSIS — M62830 Muscle spasm of back: Secondary | ICD-10-CM | POA: Diagnosis not present

## 2023-02-16 DIAGNOSIS — M5416 Radiculopathy, lumbar region: Secondary | ICD-10-CM | POA: Diagnosis not present

## 2023-02-16 DIAGNOSIS — M9905 Segmental and somatic dysfunction of pelvic region: Secondary | ICD-10-CM | POA: Diagnosis not present

## 2023-02-23 DIAGNOSIS — I10 Essential (primary) hypertension: Secondary | ICD-10-CM | POA: Diagnosis not present

## 2023-02-23 DIAGNOSIS — E782 Mixed hyperlipidemia: Secondary | ICD-10-CM | POA: Diagnosis not present

## 2023-02-23 DIAGNOSIS — M9905 Segmental and somatic dysfunction of pelvic region: Secondary | ICD-10-CM | POA: Diagnosis not present

## 2023-02-23 DIAGNOSIS — M9903 Segmental and somatic dysfunction of lumbar region: Secondary | ICD-10-CM | POA: Diagnosis not present

## 2023-02-23 DIAGNOSIS — G4733 Obstructive sleep apnea (adult) (pediatric): Secondary | ICD-10-CM | POA: Diagnosis not present

## 2023-02-23 DIAGNOSIS — M5416 Radiculopathy, lumbar region: Secondary | ICD-10-CM | POA: Diagnosis not present

## 2023-02-23 DIAGNOSIS — M62830 Muscle spasm of back: Secondary | ICD-10-CM | POA: Diagnosis not present

## 2023-02-23 DIAGNOSIS — Z6839 Body mass index (BMI) 39.0-39.9, adult: Secondary | ICD-10-CM | POA: Diagnosis not present

## 2023-02-23 DIAGNOSIS — G20C Parkinsonism, unspecified: Secondary | ICD-10-CM | POA: Diagnosis not present

## 2023-02-23 DIAGNOSIS — I2510 Atherosclerotic heart disease of native coronary artery without angina pectoris: Secondary | ICD-10-CM | POA: Diagnosis not present

## 2023-02-23 DIAGNOSIS — E66812 Obesity, class 2: Secondary | ICD-10-CM | POA: Diagnosis not present

## 2023-02-23 DIAGNOSIS — Z23 Encounter for immunization: Secondary | ICD-10-CM | POA: Diagnosis not present

## 2023-03-02 DIAGNOSIS — M62830 Muscle spasm of back: Secondary | ICD-10-CM | POA: Diagnosis not present

## 2023-03-02 DIAGNOSIS — M9905 Segmental and somatic dysfunction of pelvic region: Secondary | ICD-10-CM | POA: Diagnosis not present

## 2023-03-02 DIAGNOSIS — M9903 Segmental and somatic dysfunction of lumbar region: Secondary | ICD-10-CM | POA: Diagnosis not present

## 2023-03-02 DIAGNOSIS — M5416 Radiculopathy, lumbar region: Secondary | ICD-10-CM | POA: Diagnosis not present

## 2023-03-16 DIAGNOSIS — M9905 Segmental and somatic dysfunction of pelvic region: Secondary | ICD-10-CM | POA: Diagnosis not present

## 2023-03-16 DIAGNOSIS — M62830 Muscle spasm of back: Secondary | ICD-10-CM | POA: Diagnosis not present

## 2023-03-16 DIAGNOSIS — M9903 Segmental and somatic dysfunction of lumbar region: Secondary | ICD-10-CM | POA: Diagnosis not present

## 2023-03-16 DIAGNOSIS — M5416 Radiculopathy, lumbar region: Secondary | ICD-10-CM | POA: Diagnosis not present

## 2023-03-18 DIAGNOSIS — I2510 Atherosclerotic heart disease of native coronary artery without angina pectoris: Secondary | ICD-10-CM | POA: Diagnosis not present

## 2023-03-30 DIAGNOSIS — M9903 Segmental and somatic dysfunction of lumbar region: Secondary | ICD-10-CM | POA: Diagnosis not present

## 2023-03-30 DIAGNOSIS — M5416 Radiculopathy, lumbar region: Secondary | ICD-10-CM | POA: Diagnosis not present

## 2023-03-30 DIAGNOSIS — M62830 Muscle spasm of back: Secondary | ICD-10-CM | POA: Diagnosis not present

## 2023-03-30 DIAGNOSIS — M9905 Segmental and somatic dysfunction of pelvic region: Secondary | ICD-10-CM | POA: Diagnosis not present

## 2023-03-31 DIAGNOSIS — Z6839 Body mass index (BMI) 39.0-39.9, adult: Secondary | ICD-10-CM | POA: Diagnosis not present

## 2023-03-31 DIAGNOSIS — I10 Essential (primary) hypertension: Secondary | ICD-10-CM | POA: Diagnosis not present

## 2023-03-31 DIAGNOSIS — G20C Parkinsonism, unspecified: Secondary | ICD-10-CM | POA: Diagnosis not present

## 2023-03-31 DIAGNOSIS — R7303 Prediabetes: Secondary | ICD-10-CM | POA: Diagnosis not present

## 2023-03-31 DIAGNOSIS — E782 Mixed hyperlipidemia: Secondary | ICD-10-CM | POA: Diagnosis not present

## 2023-04-09 ENCOUNTER — Encounter: Payer: Self-pay | Admitting: Gastroenterology

## 2023-04-28 NOTE — H&P (Signed)
Pre-Procedure H&P   Patient ID: Kyle Conner is a 73 y.o. male.  Gastroenterology Provider: Jaynie Collins, DO  Referring Provider: Fransico Setters, NP PCP: Marguarite Arbour, MD  Date: 04/29/2023  HPI Kyle Conner is a 74 y.o. male who presents today for Colonoscopy for Personal history of colon polyps, family history of colon cancer .  Patient last had adenomatous polyp in 2005.  He last underwent colonoscopy in June 2019 with diverticulosis and internal hemorrhoids. Colonoscopy in January 2014 with left-sided diverticulosis, internal hemorrhoids and hyperplastic polyps.  Family history of colon cancer in his mother in her 42s Hemoglobin 14.6 MCV 87 platelets 241,000 creatinine 1.2 Reports regular bowel movements without melena or hematochezia   Past Medical History:  Diagnosis Date   Hyperlipidemia    Hypertension     Past Surgical History:  Procedure Laterality Date   CORONARY ANGIOPLASTY WITH STENT PLACEMENT  2003   Dr. Cassie Freer    Family History Mother-CRC 70s No other h/o GI disease or malignancy  Review of Systems  Constitutional:  Negative for activity change, appetite change, chills, diaphoresis, fatigue, fever and unexpected weight change.  HENT:  Negative for trouble swallowing and voice change.   Respiratory:  Negative for shortness of breath and wheezing.   Cardiovascular:  Negative for chest pain, palpitations and leg swelling.  Gastrointestinal:  Negative for abdominal distention, abdominal pain, anal bleeding, blood in stool, constipation, diarrhea, nausea and vomiting.  Musculoskeletal:  Negative for arthralgias and myalgias.  Skin:  Negative for color change and pallor.  Neurological:  Negative for dizziness, syncope and weakness.  Psychiatric/Behavioral:  Negative for confusion. The patient is not nervous/anxious.   All other systems reviewed and are negative.    Medications No current facility-administered medications  on file prior to encounter.   Current Outpatient Medications on File Prior to Encounter  Medication Sig Dispense Refill   bisoprolol-hydrochlorothiazide (ZIAC) 5-6.25 MG tablet Take 1 tablet by mouth daily.     rosuvastatin (CRESTOR) 10 MG tablet Take 10 mg by mouth daily.     aspirin EC 81 MG tablet Take 81 mg by mouth daily. Swallow whole.     carbidopa-levodopa (SINEMET IR) 25-100 MG tablet Take 0.5 tablets by mouth.     omeprazole (PRILOSEC) 40 MG capsule Take 40 mg by mouth daily.      Pertinent medications related to GI and procedure were reviewed by me with the patient prior to the procedure   Current Facility-Administered Medications:    0.9 %  sodium chloride infusion, , Intravenous, Continuous, Jaynie Collins, DO, Last Rate: 20 mL/hr at 04/29/23 1014, New Bag at 04/29/23 1014  sodium chloride 20 mL/hr at 04/29/23 1014       No Known Allergies Allergies were reviewed by me prior to the procedure  Objective   Body mass index is 38.48 kg/m. Vitals:   04/29/23 0951  BP: (!) 167/77  Pulse: 63  Resp: 17  Temp: 97.6 F (36.4 C)  TempSrc: Temporal  SpO2: 94%  Weight: 121.7 kg  Height: 5\' 10"  (1.778 m)     Physical Exam Vitals and nursing note reviewed.  Constitutional:      General: He is not in acute distress.    Appearance: Normal appearance. He is obese. He is not ill-appearing, toxic-appearing or diaphoretic.  HENT:     Head: Normocephalic and atraumatic.     Nose: Nose normal.     Mouth/Throat:     Mouth:  Mucous membranes are moist.     Pharynx: Oropharynx is clear.  Eyes:     General: No scleral icterus.    Extraocular Movements: Extraocular movements intact.  Cardiovascular:     Rate and Rhythm: Normal rate and regular rhythm.     Heart sounds: Normal heart sounds. No murmur heard.    No friction rub. No gallop.  Pulmonary:     Effort: Pulmonary effort is normal. No respiratory distress.     Breath sounds: Normal breath sounds. No wheezing,  rhonchi or rales.  Abdominal:     General: Bowel sounds are normal. There is no distension.     Palpations: Abdomen is soft.     Tenderness: There is no abdominal tenderness. There is no guarding or rebound.  Musculoskeletal:     Cervical back: Neck supple.     Right lower leg: No edema.     Left lower leg: No edema.  Skin:    General: Skin is warm and dry.     Coloration: Skin is not jaundiced or pale.  Neurological:     General: No focal deficit present.     Mental Status: He is alert and oriented to person, place, and time. Mental status is at baseline.  Psychiatric:        Mood and Affect: Mood normal.        Behavior: Behavior normal.        Thought Content: Thought content normal.        Judgment: Judgment normal.      Assessment:  Kyle Conner is a 73 y.o. male  who presents today for Colonoscopy for Personal history of colon polyps, family history of colon cancer .  Plan:  Colonoscopy with possible intervention today  Colonoscopy with possible biopsy, control of bleeding, polypectomy, and interventions as necessary has been discussed with the patient/patient representative. Informed consent was obtained from the patient/patient representative after explaining the indication, nature, and risks of the procedure including but not limited to death, bleeding, perforation, missed neoplasm/lesions, cardiorespiratory compromise, and reaction to medications. Opportunity for questions was given and appropriate answers were provided. Patient/patient representative has verbalized understanding is amenable to undergoing the procedure.   Jaynie Collins, DO  [facility] Gastroenterology  Portions of the record may have been created with voice recognition software. Occasional wrong-word or 'sound-a-like' substitutions may have occurred due to the inherent limitations of voice recognition software.  Read the chart carefully and recognize, using context, where  substitutions may have occurred.

## 2023-04-29 ENCOUNTER — Ambulatory Visit: Payer: Medicare Other | Admitting: Anesthesiology

## 2023-04-29 ENCOUNTER — Encounter
Admission: RE | Disposition: A | Discharge status: Home / Self Care | Payer: Self-pay | Source: Home / Self Care | Attending: Gastroenterology

## 2023-04-29 ENCOUNTER — Ambulatory Visit
Admission: RE | Admit: 2023-04-29 | Discharge: 2023-04-29 | Disposition: A | Discharge status: Home / Self Care | Payer: Medicare Other | Attending: Gastroenterology | Admitting: Gastroenterology

## 2023-04-29 ENCOUNTER — Encounter: Payer: Self-pay | Admitting: Gastroenterology

## 2023-04-29 DIAGNOSIS — Z800 Family history of malignant neoplasm of digestive organs: Secondary | ICD-10-CM | POA: Diagnosis not present

## 2023-04-29 DIAGNOSIS — I10 Essential (primary) hypertension: Secondary | ICD-10-CM | POA: Diagnosis not present

## 2023-04-29 DIAGNOSIS — K5730 Diverticulosis of large intestine without perforation or abscess without bleeding: Secondary | ICD-10-CM | POA: Diagnosis not present

## 2023-04-29 DIAGNOSIS — I2510 Atherosclerotic heart disease of native coronary artery without angina pectoris: Secondary | ICD-10-CM | POA: Diagnosis not present

## 2023-04-29 DIAGNOSIS — Z955 Presence of coronary angioplasty implant and graft: Secondary | ICD-10-CM | POA: Diagnosis not present

## 2023-04-29 DIAGNOSIS — Z1211 Encounter for screening for malignant neoplasm of colon: Secondary | ICD-10-CM | POA: Insufficient documentation

## 2023-04-29 DIAGNOSIS — K640 First degree hemorrhoids: Secondary | ICD-10-CM | POA: Diagnosis not present

## 2023-04-29 HISTORY — DX: Parkinsonism, unspecified: G20.C

## 2023-04-29 HISTORY — DX: Prediabetes: R73.03

## 2023-04-29 HISTORY — DX: Essential (primary) hypertension: I10

## 2023-04-29 HISTORY — DX: Hyperlipidemia, unspecified: E78.5

## 2023-04-29 HISTORY — PX: POLYPECTOMY: SHX5525

## 2023-04-29 HISTORY — PX: COLONOSCOPY WITH PROPOFOL: SHX5780

## 2023-04-29 SURGERY — COLONOSCOPY WITH PROPOFOL
Anesthesia: General

## 2023-04-29 MED ORDER — LIDOCAINE HCL (PF) 2 % IJ SOLN
INTRAMUSCULAR | Status: AC
  Filled 2023-04-29: qty 5

## 2023-04-29 MED ORDER — SODIUM CHLORIDE 0.9 % IV SOLN: INTRAVENOUS | Status: DC

## 2023-04-29 MED ORDER — LIDOCAINE HCL (CARDIAC) PF 100 MG/5ML IV SOSY
PREFILLED_SYRINGE | INTRAVENOUS | Status: DC | PRN
  Administered 2023-04-29: 60 mg via INTRAVENOUS

## 2023-04-29 MED ORDER — PROPOFOL 10 MG/ML IV BOLUS
INTRAVENOUS | Status: DC | PRN
  Administered 2023-04-29: 30 mg via INTRAVENOUS
  Administered 2023-04-29: 20 mg via INTRAVENOUS
  Administered 2023-04-29: 10 mg via INTRAVENOUS
  Administered 2023-04-29: 40 mg via INTRAVENOUS
  Administered 2023-04-29: 100 mg via INTRAVENOUS
  Administered 2023-04-29: 50 mg via INTRAVENOUS
  Administered 2023-04-29: 30 mg via INTRAVENOUS
  Administered 2023-04-29: 20 mg via INTRAVENOUS
  Administered 2023-04-29: 30 mg via INTRAVENOUS
  Administered 2023-04-29: 20 mg via INTRAVENOUS
  Administered 2023-04-29: 50 mg via INTRAVENOUS

## 2023-04-29 NOTE — Anesthesia Postprocedure Evaluation (Signed)
Anesthesia Post Note  Patient: Kyle Conner  Procedure(s) Performed: COLONOSCOPY WITH PROPOFOL POLYPECTOMY  Patient location during evaluation: Endoscopy Anesthesia Type: General Level of consciousness: awake and alert Pain management: pain level controlled Vital Signs Assessment: post-procedure vital signs reviewed and stable Respiratory status: spontaneous breathing, nonlabored ventilation and respiratory function stable Cardiovascular status: blood pressure returned to baseline and stable Postop Assessment: no apparent nausea or vomiting Anesthetic complications: no   No notable events documented.   Last Vitals:  Vitals:   04/29/23 1127 04/29/23 1137  BP: 111/67 109/70  Pulse:  (!) 58  Resp:    Temp:    SpO2:  96%    Last Pain:  Vitals:   04/29/23 1137  TempSrc:   PainSc: 0-No pain                 Foye Deer

## 2023-04-29 NOTE — Op Note (Addendum)
 [facility] Gastroenterology Patient Name: Kyle Conner Procedure Date: 04/29/2023 10:36 AM MRN: [id number] Account #: [id number] Date of Birth: 05-16-1950 Admit Type: Outpatient Age: 73 Room: [facility] ENDO ROOM 1 Gender: Male Note Status: Addendum Instrument Name: Colonoscope [id number] Procedure:             Colonoscopy Indications:           High risk colon cancer surveillance: Personal history                         of colonic polyps, Family history of colon cancer Providers:             Trenda Moots, DO Referring MD:          Duane Lope. Judithann Sheen, MD (Referring MD) Medicines:             Monitored Anesthesia Care Complications:         No immediate complications. Estimated blood loss:                         Minimal. Procedure:             Pre-Anesthesia Assessment:                        - Prior to the procedure, a History and Physical was                         performed, and patient medications and allergies were                         reviewed. The patient is competent. The risks and                         benefits of the procedure and the sedation options and                         risks were discussed with the patient. All questions                         were answered and informed consent was obtained.                         Patient identification and proposed procedure were                         verified by the physician, the nurse, the anesthetist                         and the technician in the endoscopy suite. Mental                         Status Examination: alert and oriented. Airway                         Examination: normal oropharyngeal airway and neck                         mobility. Respiratory Examination: clear to  auscultation. CV Examination: RRR, no murmurs, no S3                         or S4. Prophylactic Antibiotics: The patient does not                         require prophylactic  antibiotics. Prior                         Anticoagulants: The patient has taken no anticoagulant                         or antiplatelet agents. ASA Grade Assessment: III - A                         patient with severe systemic disease. After reviewing                         the risks and benefits, the patient was deemed in                         satisfactory condition to undergo the procedure. The                         anesthesia plan was to use monitored anesthesia care                         (MAC). Immediately prior to administration of                         medications, the patient was re-assessed for adequacy                         to receive sedatives. The heart rate, respiratory                         rate, oxygen saturations, blood pressure, adequacy of                         pulmonary ventilation, and response to care were                         monitored throughout the procedure. The physical                         status of the patient was re-assessed after the                         procedure.                        After obtaining informed consent, the colonoscope was                         passed under direct vision. Throughout the procedure,                         the patient's blood pressure, pulse, and oxygen  saturations were monitored continuously. The                         Colonoscope was introduced through the anus and                         advanced to the the terminal ileum, with                         identification of the appendiceal orifice and IC                         valve. The colonoscopy was performed without                         difficulty. The patient tolerated the procedure well.                         The quality of the bowel preparation was evaluated                         using the BBPS [facility] Bowel Preparation Scale) with                         scores of: Right Colon = 2 (minor amount of residual                          staining, small fragments of stool and/or opaque                         liquid, but mucosa seen well), Transverse Colon = 3                         (entire mucosa seen well with no residual staining,                         small fragments of stool or opaque liquid) and Left                         Colon = 2 (minor amount of residual staining, small                         fragments of stool and/or opaque liquid, but mucosa                         seen well). The total BBPS score equals 7. The quality                         of the bowel preparation was good. The terminal ileum,                         ileocecal valve, appendiceal orifice, and rectum were                         photographed. Findings:      The perianal and digital rectal examinations were normal. Pertinent       negatives include normal sphincter  tone.      The terminal ileum appeared normal. Estimated blood loss: none.      Retroflexion in the right colon was performed.      A 2 to 3 mm polyp was found in the transverse colon. The polyp was       sessile. The polyp was removed with a cold snare. Resection and       retrieval were complete. Estimated blood loss was minimal.      Multiple small-mouthed diverticula were found in the entire colon.       Estimated blood loss: none.      Non-bleeding internal hemorrhoids were found during retroflexion. The       hemorrhoids were Grade I (internal hemorrhoids that do not prolapse).       Estimated blood loss: none.      The exam was otherwise without abnormality on direct and retroflexion       views. Impression:            - The examined portion of the ileum was normal.                        - One 2 to 3 mm polyp in the transverse colon, removed                         with a cold snare. Resected and retrieved.                        - Diverticulosis in the entire examined colon.                        - Non-bleeding internal hemorrhoids.                         - The examination was otherwise normal on direct and                         retroflexion views. Recommendation:        - Patient has a contact number available for                         emergencies. The signs and symptoms of potential                         delayed complications were discussed with the patient.                         Return to normal activities tomorrow. Written                         discharge instructions were provided to the patient.                        - Discharge patient to home.                        - Resume previous diet.                        - Continue present medications.                        -  Repeat colonoscopy in 5 years for surveillance.                        - Return to referring physician as previously                         scheduled.                        - The findings and recommendations were discussed with                         the patient. Procedure Code(s):     --- Professional ---                        [phone], Colonoscopy, flexible; with removal of                         tumor(s), polyp(s), or other lesion(s) by snare                         technique Diagnosis Code(s):     --- Professional ---                        Z86.010, Personal history of colonic polyps                        K64.0, First degree hemorrhoids                        D12.3, Benign neoplasm of transverse colon (hepatic                         flexure or splenic flexure)                        Z80.0, Family history of malignant neoplasm of                         digestive organs                        K57.30, Diverticulosis of large intestine without                         perforation or abscess without bleeding CPT copyright 2022 American Medical Association. All rights reserved. The codes documented in this report are preliminary and upon coder review may  be revised to meet current compliance requirements. Attending Participation:      I  personally performed the entire procedure. Elfredia Nevins, DO Jaynie Collins DO, DO 04/29/2023 11:18:25 AM This report has been signed electronically. Number of Addenda: 1 Note Initiated On: 04/29/2023 10:36 AM Scope Withdrawal Time: 0 hours 7 minutes 51 seconds  Total Procedure Duration: 0 hours 17 minutes 30 seconds  Estimated Blood Loss:  Estimated blood loss was minimal.      [facility] Addendum Number: 1   Addendum Date: 06/07/2023 11:41:18 AM      Addendum: Correction- the transverse polyp was resected in completion       with cold snare, but not retrieved. Estimated blood loss was minimal.  Elfredia Nevins, DO Jaynie Collins DO, DO 06/07/2023 11:42:23 AM This report has been signed electronically.

## 2023-04-29 NOTE — Anesthesia Preprocedure Evaluation (Addendum)
Anesthesia Evaluation  Patient identified by MRN, date of birth, ID band Patient awake    Reviewed: Allergy & Precautions, H&P , NPO status , Patient's Chart, lab work & pertinent test results  Airway Mallampati: II  TM Distance: >3 FB Neck ROM: full    Dental  (+) Chipped   Pulmonary neg pulmonary ROS   Pulmonary exam normal        Cardiovascular Exercise Tolerance: Good hypertension, + CAD and + Cardiac Stents  Normal cardiovascular exam     Neuro/Psych negative neurological ROS  negative psych ROS   GI/Hepatic negative GI ROS, Neg liver ROS,,,  Endo/Other  negative endocrine ROS    Renal/GU negative Renal ROS  negative genitourinary   Musculoskeletal   Abdominal  (+) + obese  Peds  Hematology negative hematology ROS (+)   Anesthesia Other Findings Past Medical History: No date: Hyperlipidemia No date: Hypertension  Past Surgical History: 2003: CORONARY ANGIOPLASTY WITH STENT PLACEMENT     Comment:  Dr. Cassie Freer  BMI    Body Mass Index: 38.48 kg/m      Reproductive/Obstetrics negative OB ROS                             Anesthesia Physical Anesthesia Plan  ASA: 3  Anesthesia Plan: General   Post-op Pain Management:    Induction: Intravenous  PONV Risk Score and Plan: Propofol infusion and TIVA  Airway Management Planned: Natural Airway  Additional Equipment:   Intra-op Plan:   Post-operative Plan:   Informed Consent: I have reviewed the patients History and Physical, chart, labs and discussed the procedure including the risks, benefits and alternatives for the proposed anesthesia with the patient or authorized representative who has indicated his/her understanding and acceptance.     Dental Advisory Given  Plan Discussed with: CRNA and Surgeon  Anesthesia Plan Comments:        Anesthesia Quick Evaluation

## 2023-04-29 NOTE — Interval H&P Note (Signed)
History and Physical Interval Note: Preprocedure H&P from 04/29/23  was reviewed and there was no interval change after seeing and examining the patient.  Written consent was obtained from the patient after discussion of risks, benefits, and alternatives. Patient has consented to proceed with Colonoscopy with possible intervention   04/29/2023 10:46 AM  Kyle Conner  has presented today for surgery, with the diagnosis of V12.72 (ICD-9-CM) - Z86.0101 (ICD-10-CM) - Hx of adenomatous colonic polyps.  The various methods of treatment have been discussed with the patient and family. After consideration of risks, benefits and other options for treatment, the patient has consented to  Procedure(s): COLONOSCOPY WITH PROPOFOL (N/A) as a surgical intervention.  The patient's history has been reviewed, patient examined, no change in status, stable for surgery.  I have reviewed the patient's chart and labs.  Questions were answered to the patient's satisfaction.     Jaynie Collins

## 2023-04-29 NOTE — Transfer of Care (Signed)
Immediate Anesthesia Transfer of Care Note  Patient: Kyle Conner  Procedure(s) Performed: COLONOSCOPY WITH PROPOFOL POLYPECTOMY  Patient Location: PACU and Endoscopy Unit  Anesthesia Type:General  Level of Consciousness: drowsy  Airway & Oxygen Therapy: Patient Spontanous Breathing and Patient connected to nasal cannula oxygen  Post-op Assessment: Report given to RN and Post -op Vital signs reviewed and stable  Post vital signs: Reviewed and stable  Last Vitals:  Vitals Value Taken Time  BP 91/41 04/29/23 1119  Temp 36.1 C 04/29/23 1117  Pulse 59 04/29/23 1120  Resp 15 04/29/23 1120  SpO2 95 % 04/29/23 1120  Vitals shown include unfiled device data.  Last Pain:  Vitals:   04/29/23 1117  TempSrc: Temporal  PainSc: Asleep         Complications: No notable events documented.

## 2023-04-30 ENCOUNTER — Encounter: Payer: Self-pay | Admitting: Gastroenterology
